# Patient Record
Sex: Male | Born: 1935 | Race: White | Hispanic: No | Marital: Married | Smoking: Never smoker
Health system: Southern US, Community
[De-identification: ages and names within clinical notes are randomized; demographics above are authoritative.]

## PROBLEM LIST (undated history)

## (undated) DIAGNOSIS — Z96651 Presence of right artificial knee joint: Secondary | ICD-10-CM

## (undated) DIAGNOSIS — I10 Essential (primary) hypertension: Secondary | ICD-10-CM

## (undated) DIAGNOSIS — M1990 Unspecified osteoarthritis, unspecified site: Secondary | ICD-10-CM

## (undated) DIAGNOSIS — R011 Cardiac murmur, unspecified: Secondary | ICD-10-CM

## (undated) HISTORY — PX: JOINT REPLACEMENT: SHX530

## (undated) HISTORY — PX: HERNIA REPAIR: SHX51

## (undated) HISTORY — PX: OTHER SURGICAL HISTORY: SHX169

---

## 2006-11-25 ENCOUNTER — Inpatient Hospital Stay (HOSPITAL_COMMUNITY): Admission: RE | Admit: 2006-11-25 | Discharge: 2006-11-26 | Payer: Self-pay | Admitting: Orthopedic Surgery

## 2009-12-15 ENCOUNTER — Ambulatory Visit (HOSPITAL_COMMUNITY): Admission: RE | Admit: 2009-12-15 | Discharge: 2009-12-15 | Payer: Self-pay | Admitting: Urology

## 2010-06-15 ENCOUNTER — Ambulatory Visit (HOSPITAL_COMMUNITY): Admission: RE | Admit: 2010-06-15 | Discharge: 2010-06-15 | Payer: Self-pay | Admitting: Urology

## 2010-10-25 ENCOUNTER — Other Ambulatory Visit (HOSPITAL_COMMUNITY): Payer: Self-pay | Admitting: Urology

## 2010-10-25 ENCOUNTER — Ambulatory Visit (HOSPITAL_COMMUNITY)
Admission: RE | Admit: 2010-10-25 | Discharge: 2010-10-25 | Disposition: A | Discharge status: Home / Self Care | Payer: Medicare Other | Source: Ambulatory Visit | Attending: Urology | Admitting: Urology

## 2010-10-25 ENCOUNTER — Other Ambulatory Visit: Payer: Self-pay | Admitting: Urology

## 2010-10-25 ENCOUNTER — Encounter (HOSPITAL_COMMUNITY): Payer: Medicare Other

## 2010-10-25 DIAGNOSIS — Z01812 Encounter for preprocedural laboratory examination: Secondary | ICD-10-CM | POA: Insufficient documentation

## 2010-10-25 DIAGNOSIS — N200 Calculus of kidney: Secondary | ICD-10-CM | POA: Insufficient documentation

## 2010-10-25 DIAGNOSIS — Z01818 Encounter for other preprocedural examination: Secondary | ICD-10-CM | POA: Insufficient documentation

## 2010-10-25 DIAGNOSIS — I10 Essential (primary) hypertension: Secondary | ICD-10-CM | POA: Insufficient documentation

## 2010-10-25 DIAGNOSIS — Z01810 Encounter for preprocedural cardiovascular examination: Secondary | ICD-10-CM | POA: Insufficient documentation

## 2010-10-25 LAB — BASIC METABOLIC PANEL
CO2: 29 mEq/L (ref 19–32)
Calcium: 9.1 mg/dL (ref 8.4–10.5)
Creatinine, Ser: 0.97 mg/dL (ref 0.4–1.5)
GFR calc Af Amer: >60 mL/min (ref >60)
GFR calc non Af Amer: >60 mL/min (ref >60)
Glucose, Bld: 106 mg/dL — ABNORMAL HIGH (ref 70–99)
Sodium: 138 mEq/L (ref 135–145)

## 2010-10-25 LAB — CBC
MCH: 32.2 pg (ref 26.0–34.0)
MCHC: 33.6 g/dL (ref 30.0–36.0)
RDW: 12.7 % (ref 11.5–15.5)

## 2010-10-25 LAB — SURGICAL PCR SCREEN: MRSA, PCR: NEGATIVE

## 2010-10-27 ENCOUNTER — Ambulatory Visit (HOSPITAL_COMMUNITY)
Admission: RE | Admit: 2010-10-27 | Discharge: 2010-10-27 | Disposition: A | Discharge status: Home / Self Care | Payer: Medicare Other | Source: Ambulatory Visit | Attending: Urology | Admitting: Urology

## 2010-10-27 DIAGNOSIS — N201 Calculus of ureter: Secondary | ICD-10-CM | POA: Insufficient documentation

## 2010-10-29 NOTE — Op Note (Signed)
NAME:  Julian Davis, Julian Davis NO.:  [id number]  MEDICAL RECORD NO.:  [id number]           PATIENT TYPE:  O  LOCATION:  DAYL                         FACILITY:  [facility]  PHYSICIAN:  Heloise Purpura, MD      DATE OF BIRTH:  [birth year]  DATE OF PROCEDURE:  10/27/2010 DATE OF DISCHARGE:                              OPERATIVE REPORT   PREOPERATIVE DIAGNOSIS:  Left ureteral calculi.  POSTOPERATIVE DIAGNOSIS:  Left ureteral calculi.  PROCEDURES: 1. Cystoscopy. 2. Left retrograde pyelography with interpretation. 3. Left ureteroscopy with laser lithotripsy and stone removal. 4. Left ureteral stent placement (6 x 24).  SURGEON:  Heloise Purpura, MD  ANESTHESIA:  General.  ESTIMATED BLOOD LOSS:  Minimal.  INDICATION:  Julian Davis is an 77 year old gentleman with a history of urolithiasis.  He has undergone lithotripsy for proximal left ureteral calculi in the past and although he did pass some fragments, he does have retained left ureteral fragments on followup imaging studies despite being asymptomatic.  I therefore recommended that he proceed with more definitive ureteroscopic therapy considering his inability to pass the stones after shock wave lithotripsy.  The potential risks, complications, and alternative treatment options have been discussed in detail and informed consent was obtained.  DESCRIPTION OF PROCEDURE:  The patient was taken to the operating room and a general anesthetic was administered.  He was given preoperative antibiotics, placed in the dorsal lithotomy position, and prepped and draped in the usual sterile fashion.  Next, a preoperative time-out was performed.  Cystourethroscopy was performed which revealed no anterior urethral abnormalities.  The prostatic urethra was significant for a very large median lobe which was somewhat friable.  Inspection of the bladder revealed moderate trabeculation throughout the bladder without any bladder tumors, stones,  or other mucosal pathology.  The left ureteral orifice was then identified and intubated with a 6-French ureteral catheter.  Omnipaque contrast was injected which revealed a large filling defect in the vicinity of the distal ureter, which was consistent with a radio-opaque calculus seen on his scout images.  No other ureteral abnormalities were identified.  A 0.038 sensor guidewire was then advanced passed the stone and up into the left renal pelvis without resistance.  A 6-French semi-rigid ureteroscope was then advanced into the distal left ureter and the safety wire, which had been placed, was noted to be submucosal.  Therefore, a new 0.038 sensor guidewire was placed within the true lumen of the ureter up into the renal pelvis again under fluoroscopic guidance but also under direct vision within the distal ureter.  The previous wire was removed.  This second wire was then left in place and the ureteroscope was removed and replaced next in the distal ureter.  A 365 micron holmium laser fiber was then used to fragment the stone on the setting of 0.6 joules and 6 Hz.  The stone was fragmented into small pieces which were then removed with a 0 tip nitinol basket.  Once all fragments were removed from the ureter confirmed under direct vision, the wire was back loaded on the cystoscope and a 6 x 26 double-J ureteral  stent was advanced over the wire using Seldinger technique.  It was positioned appropriately under fluoroscopic and cystoscopic guidance and the wire was removed with a good curl noted in the renal pelvis as well as in the bladder.  The patient's bladder was emptied and the procedure ended.  He tolerated the procedure well and was able to be awakened and transferred to recovery unit in satisfactory condition.     Heloise Purpura, MD     LB/MEDQ  D:  10/27/2010  T:  10/27/2010  Job:  [id number]  Electronically Signed by Heloise Purpura MD on 10/29/2010 04:44:26 PM

## 2010-12-29 NOTE — Op Note (Signed)
NAME:  Julian Davis, Julian Davis NO.:  [id number]   MEDICAL RECORD NO.:  [id number]          PATIENT TYPE:  INP   LOCATION:  0005                         FACILITY:  [facility]   PHYSICIAN:  Madlyn Frankel. Charlann Boxer, M.D.  DATE OF BIRTH:  1927-03-25   DATE OF PROCEDURE:  11/25/2006  DATE OF DISCHARGE:                               OPERATIVE REPORT   PREOPERATIVE DIAGNOSIS:  Right knee medial compartment osteoarthritis.   POSTOPERATIVE DIAGNOSIS:  Right knee medial compartment osteoarthritis.   PROCEDURE:  Right unicompartmental knee replacement using a Biomet  Oxford knee system, size medium femur, C tibia right medial, and a size  6 mm insert.   SURGEON:  Madlyn Frankel. Charlann Boxer, M.D.   ASSISTANT:  Yetta Glassman. Mann, PA-C   ANESTHESIA:  Dermal, spinal.   COMPLICATIONS:  None.   DRAINS:  None.   TOURNIQUET TIME:  75 minutes at 250 mmHg.   INDICATIONS FOR PROCEDURE:  Julian Davis is a very pleasant 77 year old  gentleman who presented to the office for evaluation of knee pain with  the questions concerning a unicompartmental knee replacement.  Radiographs revealed bone-on-bone articulation of the medial side of the  knee with a correctable deformity.  His pain was primarily medial and we  discussed the options available this point, discussed nonoperative  intervention including a heel wedge or use of brace versus operative  intervention of a unicompartmental versus total knee replacement.  Given  the fact that he has not responded well to conservative measures, he  wished to proceed with surgical intervention.  He wished to discuss  unicompartmental versus total knee replacement, so we had that  discussion today.  Risks and benefits of both including the potential  need for revision surgery with a partial knee replacement at 77 years of  age was all reviewed.  Consent was obtained for partial knee  replacement.   PROCEDURE IN DETAIL:  The patient was brought to operative theater.  Once adequate anesthesia and preoperative antibiotics, 1 g of Ancef,  were administered, the patient was positioned supine.  A bump was placed  underneath the right hip.  The Oxford leg holder was then placed into  and the hip was flexed and abducted.  Landmarks identified and  coordinates of the room for evaluation for sawing.   The right lower extremity was placed in a tourniquet, then prepped and  prepped and draped in sterile fashion.  A paramidline incision was made  for medial arthrotomy.  The patella was subluxated as much as needed but  not dislocated.   Following arthrotomy the patient was noted to significant arthritis in  the medial compartment of the knee with a large effusion.  Bone on bone  articulation noted.  The patellofemoral compartment had some osteophytes  and small area of some grade 3 chondromalacia but nothing nearly as bad  as the medial aspect of the knee.   I at this point took time to debride osteophytes off the medial aspect  of the knee, off the tibia and the femur as well as in the notch.  Some  debridement of  patellar osteophytes was carried out as well.   Following this, attention was directed to the tibia.  With the  extramedullary guide in place and retractors placed, I resected bone off  the medial proximal tibia.  The extramedullary rod was placed parallel  to the tibia and appeared to medial along the tibia spine all way down.  The reciprocating saw was used first, followed by oscillating.  A of  wafer bone indicated significant degenerative changes but then a wear  pattern consistent with that of a functional anterior cruciate ligament.  Once I made this cut, I determined the size C femur would fit best.  This included debrided osteophytes off the medial proximal tibia.  That  was all the decompression of the MCL that was carried out.  At this  point I sized it and felt that it was at least a 5 spacer and maybe a 6.  I went ahead and placed the  intramedullary rod into the femur.  We then  went ahead and centered the medial femoral cutting jig into the middle  portion of the femur.  It was oriented in all planes parallel to the  intramedullary rod.  I then made the posterior cut, which was a wafer  that was consistent with that of the same segments of the size medium  femur.   At this point i went ahead and put the 0 spigot in, milled the distal  femur and trialled first with a 5 then but felt the 6 was better.  I was  able to get a #1 spacer block in with the knee at 20 degrees of  extension with no retractors.  For this reason I used the #5 spigot.  The #5 spigot was placed and the distal femur milled.  Following  debridement of the osteophytes medial, lateral and around the tibia as  well as the use of an osteotome anteriorly to allow for excursion of the  polyethylene, a trial was carried out again.  The felt very stable with  a size 6 spacer in both extension and flexion with a couple millimeters  of lift-off of 90 and at 20 degrees of flexion.  Given all these  parameters, all components were removed.  I drilled the distal femur to  allow for cement integration.  I then injected 40 mL of 0.25% Marcaine  with epinephrine.  The final components were opened, all but the poly.  The components were cemented in position with two-cement technique,  first the tibia and then the femur.  Once I was happy that there was no  excessive cement and all cement had cured, the final #6 spacer was  chosen based on the trial.  It snapped into position with a good sound  and was stable as noted.  Given all this, the knee was reirrigated and  palpated to make sure there were no loose fragments of cartilage or  bone.   Following this the leg was brought out of the leg holder and the knee  brought out to full extension.  I irrigated the knee again to make sure  there was no debris.  Then with the knee at 90 degrees of flexion, I reapproximated  the extensor mechanism with #1 Vicryl in interrupted  fashion.  Vicryl 2-0 was used in the subcu layer, followed by a running  4-0 Monocryl.  The patient's knee was then cleaned, dried and dressed  sterilely with Steri-Strips, dressing sponges and a bulky wrap.  He was  brought to recovery stable.      Madlyn Frankel Charlann Boxer, M.D.  Electronically Signed     MDO/MEDQ  D:  11/25/2006  T:  11/25/2006  Job:  [id number]

## 2010-12-29 NOTE — H&P (Signed)
NAME:  Julian Davis, Julian Davis NO.:  [id number]   MEDICAL RECORD NO.:  [id number]        PATIENT TYPE:  LINP   LOCATION:                               FACILITY:  [facility]   PHYSICIAN:  Madlyn Frankel. Charlann Boxer, M.D.  DATE OF BIRTH:  [birth year]   DATE OF ADMISSION:  11/25/2006  DATE OF DISCHARGE:                              HISTORY & PHYSICAL   PROCEDURE:  Right unicondylar knee a   ATTENDING PHYSICIAN:  Dr. Durene Romans procedure will be a right  unicondylar knee arthroplasty.   CHIEF COMPLAINT:  Right knee pain.   HISTORY OF PRESENT ILLNESS:  This is a 77 year old male with a history  of persistent progressive knee pain.  It has been refractory to all  conservative treatments.  He has been cleared pre-surgically by Dr.  Roney Marion for a right partial knee replacement.   PAST MEDICAL HISTORY:  Significant for hypertension, recently diagnosed.   FAMILY HISTORY:  Cancer.   SOCIAL HISTORY:  He is married.  Wife will be the primary caregiver  after the surgery.   DRUG ALLERGIES:  SULFA CAUSES BREAKOUT.  APPARENTLY, HCTZ HAS CAUSED  SOME DIZZINESS AND MALAISE IN THE PAST.   MEDICATIONS:  1. Lisinopril 5 mg one p.o. daily.  2. Aspirin 81 mg one p.o. daily.  3. Multi-vitamin.   REVIEW OF SYSTEMS:  None other than HPI.   PHYSICAL EXAMINATION:  VITAL SIGNS:  Pulse 72, respirations 18, blood  pressure 126/90.  GENERAL:  He is awake, alert and oriented, well-developed, well-  nourished, no acute distress.  NECK:  Supple.  No carotid bruits.  CHEST/LUNGS:  Clear to auscultation bilaterally.  HEART:  Regular rate and rhythm.  S1, S2 distinct.  ABDOMEN:  Soft, nontender, nondistended.  Bowel sounds present in all  four quadrants.  GENITOURINARY:  Deferred.  EXTREMITIES:  He does have full extension and flexion to 120+ in his  right knee.  He does have some medial sided tenderness with some medial  joint line tenderness, as well.  SKIN:  Dorsalis pedis pulses positive.  NEUROLOGIC:  He has intact distal sensibilities.   LABS:  EKG, chest x-ray are all pending.   IMPRESSION:  Right medial compartment osteoarthritis.   PLAN OF ACTION:  Right partial or unicondylar knee arthroplasty on November 25, 2006, [facility], by surgeon Dr. Durene Romans.   Risks and complications were discussed.  Questions were encouraged,  answered and reviewed.     ______________________________  Yetta Glassman [address], [place]      Madlyn Frankel. Charlann Boxer, M.D.  Electronically Signed    BLM/MEDQ  D:  11/15/2006  T:  11/15/2006  Job:  [id number]   cc:   Roney [facility], M.D.

## 2011-01-03 ENCOUNTER — Other Ambulatory Visit (HOSPITAL_COMMUNITY): Payer: Self-pay | Admitting: Orthopedic Surgery

## 2011-01-03 DIAGNOSIS — T84038A Mechanical loosening of other internal prosthetic joint, initial encounter: Secondary | ICD-10-CM

## 2011-01-03 DIAGNOSIS — M25561 Pain in right knee: Secondary | ICD-10-CM

## 2011-01-10 ENCOUNTER — Encounter (HOSPITAL_COMMUNITY)
Admission: RE | Admit: 2011-01-10 | Discharge: 2011-01-10 | Disposition: A | Discharge status: Home / Self Care | Payer: Medicare Other | Source: Ambulatory Visit | Attending: Orthopedic Surgery | Admitting: Orthopedic Surgery

## 2011-01-10 ENCOUNTER — Ambulatory Visit (HOSPITAL_COMMUNITY): Payer: Medicare Other

## 2011-01-10 ENCOUNTER — Encounter (HOSPITAL_COMMUNITY): Payer: Self-pay

## 2011-01-10 DIAGNOSIS — M25561 Pain in right knee: Secondary | ICD-10-CM

## 2011-01-10 DIAGNOSIS — M25569 Pain in unspecified knee: Secondary | ICD-10-CM | POA: Insufficient documentation

## 2011-01-10 DIAGNOSIS — T84038A Mechanical loosening of other internal prosthetic joint, initial encounter: Secondary | ICD-10-CM

## 2011-01-10 DIAGNOSIS — Z96659 Presence of unspecified artificial knee joint: Secondary | ICD-10-CM | POA: Insufficient documentation

## 2011-01-10 DIAGNOSIS — T84039A Mechanical loosening of unspecified internal prosthetic joint, initial encounter: Secondary | ICD-10-CM | POA: Insufficient documentation

## 2011-01-10 HISTORY — DX: Presence of right artificial knee joint: Z96.651

## 2011-01-10 MED ORDER — TECHNETIUM TC 99M MEDRONATE IV KIT
23.9000 | PACK | Freq: Once | INTRAVENOUS | Status: AC | PRN
  Administered 2011-01-10: 23.9 via INTRAVENOUS

## 2011-08-21 DIAGNOSIS — L570 Actinic keratosis: Secondary | ICD-10-CM | POA: Diagnosis not present

## 2011-10-11 IMAGING — CR DG ABDOMEN 1V
1 series · 1 of 1 positions shown · non-contrast
Comparison: 12/15/2009

CLINICAL DATA: Preop distal left ureteral stone.

ABDOMEN - 1 VIEW

[view not recorded]
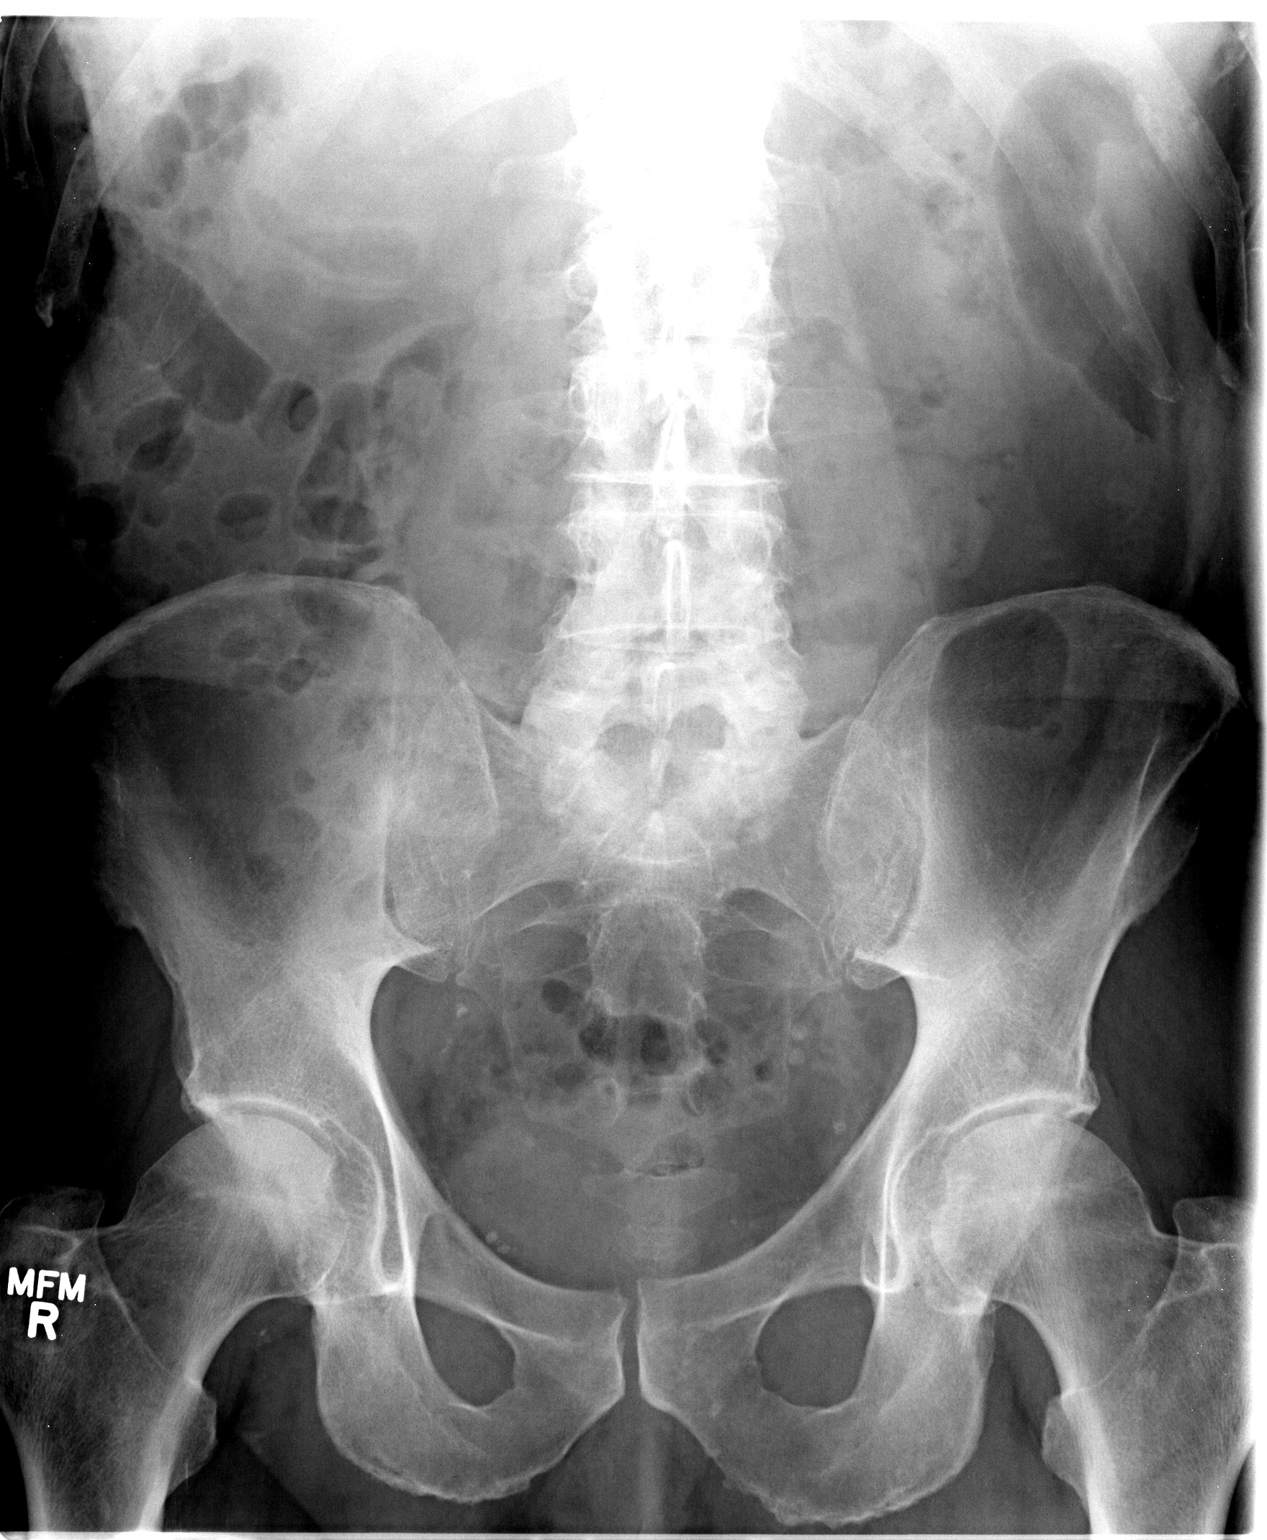

[1 of 1 positions shown; findings below may reference images not displayed]

FINDINGS: There are two 5 mm calcifications to the left of the
lower sacrum, which appear new from 12/15/2009.  Phleboliths are
seen in the anatomic pelvis.  Bowel gas pattern is unremarkable.
IMPRESSION: Two 5 mm calcifications located to the left of the lower sacrum
appear new from 12/15/2009, and may be within the distal left
ureter.

## 2011-12-31 DIAGNOSIS — J209 Acute bronchitis, unspecified: Secondary | ICD-10-CM | POA: Diagnosis not present

## 2011-12-31 DIAGNOSIS — J069 Acute upper respiratory infection, unspecified: Secondary | ICD-10-CM | POA: Diagnosis not present

## 2012-01-31 DIAGNOSIS — H2510 Age-related nuclear cataract, unspecified eye: Secondary | ICD-10-CM | POA: Diagnosis not present

## 2012-02-13 DIAGNOSIS — H2510 Age-related nuclear cataract, unspecified eye: Secondary | ICD-10-CM | POA: Diagnosis not present

## 2012-02-25 DIAGNOSIS — IMO0002 Reserved for concepts with insufficient information to code with codable children: Secondary | ICD-10-CM | POA: Diagnosis not present

## 2012-02-25 DIAGNOSIS — H2510 Age-related nuclear cataract, unspecified eye: Secondary | ICD-10-CM | POA: Diagnosis not present

## 2012-02-25 DIAGNOSIS — H269 Unspecified cataract: Secondary | ICD-10-CM | POA: Diagnosis not present

## 2012-03-06 DIAGNOSIS — I10 Essential (primary) hypertension: Secondary | ICD-10-CM | POA: Diagnosis not present

## 2012-03-06 DIAGNOSIS — N400 Enlarged prostate without lower urinary tract symptoms: Secondary | ICD-10-CM | POA: Diagnosis not present

## 2012-03-14 DIAGNOSIS — H2510 Age-related nuclear cataract, unspecified eye: Secondary | ICD-10-CM | POA: Diagnosis not present

## 2012-03-15 DIAGNOSIS — K2900 Acute gastritis without bleeding: Secondary | ICD-10-CM | POA: Diagnosis not present

## 2012-03-31 DIAGNOSIS — H269 Unspecified cataract: Secondary | ICD-10-CM | POA: Diagnosis not present

## 2012-03-31 DIAGNOSIS — IMO0002 Reserved for concepts with insufficient information to code with codable children: Secondary | ICD-10-CM | POA: Diagnosis not present

## 2012-03-31 DIAGNOSIS — H2510 Age-related nuclear cataract, unspecified eye: Secondary | ICD-10-CM | POA: Diagnosis not present

## 2012-05-07 IMAGING — NM NM BONE 3 PHASE
1 series · 6 of 6 positions shown · non-contrast
Comparison: Prior radiographs performed at [REDACTED].

CLINICAL DATA: Right knee pain. 719.46.  Previous right partial
knee replacement in 3557.

NUCLEAR MEDICINE THREE PHASE BONE SCAN
TECHNIQUE: Radionuclide angiographic images, immediate static
blood pool images, and 3-hour delayed static images were obtained
after intravenous injection of radiopharmaceutical.
Radiopharmaceutical: 23.9 mCi technetium 99m MDP IV

[Series 1: fl flow and static · 4.75mm/px · 6 of 40 frames shown]
[frame 4/40]
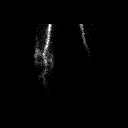
[frame 10/40  full-range]
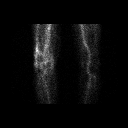
[frame 17/40  full-range]
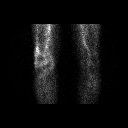
[frame 24/40  full-range]
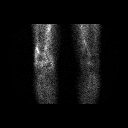
[frame 30/40  full-range]
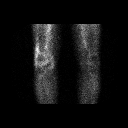
[frame 37/40  full-range]
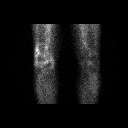

[6 of 6 positions shown; findings below may reference images not displayed]

FINDINGS: Perfusion and blood pool images demonstrateprominent
increased perfusion to the right knee with marked increased
activity in the synovium of the right knee joint as compared to the
left knee.

Delayed bone images do demonstrate increased activity under the
tibial component of the right partial knee prosthesis. There is
slight increased activity around the femoral component of the
prosthesis is well but I feel most of this activity is synovial in
origin.

There is increased activity in the lateral compartment which is
felt to be secondary to osteoarthritis.
IMPRESSION: 1.  Evidence suggestive of loosening of the tibial component of the
prosthesis.
2.  Intense increased perfusion and blood pool activity suggests
synovitis of the right knee.
3.  Osteoarthritis of the lateral compartment of the right knee.

## 2012-08-20 DIAGNOSIS — L570 Actinic keratosis: Secondary | ICD-10-CM | POA: Diagnosis not present

## 2012-08-20 DIAGNOSIS — C44319 Basal cell carcinoma of skin of other parts of face: Secondary | ICD-10-CM | POA: Diagnosis not present

## 2012-10-15 DIAGNOSIS — J0100 Acute maxillary sinusitis, unspecified: Secondary | ICD-10-CM | POA: Diagnosis not present

## 2012-10-15 DIAGNOSIS — R109 Unspecified abdominal pain: Secondary | ICD-10-CM | POA: Diagnosis not present

## 2012-10-15 DIAGNOSIS — J209 Acute bronchitis, unspecified: Secondary | ICD-10-CM | POA: Diagnosis not present

## 2012-10-31 DIAGNOSIS — J0100 Acute maxillary sinusitis, unspecified: Secondary | ICD-10-CM | POA: Diagnosis not present

## 2012-11-05 DIAGNOSIS — H353190 Nonexudative age-related macular degeneration, unspecified eye, stage unspecified: Secondary | ICD-10-CM | POA: Diagnosis not present

## 2012-12-24 DIAGNOSIS — R1084 Generalized abdominal pain: Secondary | ICD-10-CM | POA: Diagnosis not present

## 2013-01-30 DIAGNOSIS — R609 Edema, unspecified: Secondary | ICD-10-CM | POA: Diagnosis not present

## 2013-02-08 DIAGNOSIS — J019 Acute sinusitis, unspecified: Secondary | ICD-10-CM | POA: Diagnosis not present

## 2013-02-08 DIAGNOSIS — J209 Acute bronchitis, unspecified: Secondary | ICD-10-CM | POA: Diagnosis not present

## 2013-03-02 DIAGNOSIS — M25569 Pain in unspecified knee: Secondary | ICD-10-CM | POA: Diagnosis not present

## 2013-05-08 DIAGNOSIS — J0100 Acute maxillary sinusitis, unspecified: Secondary | ICD-10-CM | POA: Diagnosis not present

## 2013-05-12 DIAGNOSIS — H26499 Other secondary cataract, unspecified eye: Secondary | ICD-10-CM | POA: Diagnosis not present

## 2013-05-12 DIAGNOSIS — H353190 Nonexudative age-related macular degeneration, unspecified eye, stage unspecified: Secondary | ICD-10-CM | POA: Diagnosis not present

## 2013-05-12 DIAGNOSIS — H04129 Dry eye syndrome of unspecified lacrimal gland: Secondary | ICD-10-CM | POA: Diagnosis not present

## 2013-05-12 DIAGNOSIS — H02059 Trichiasis without entropian unspecified eye, unspecified eyelid: Secondary | ICD-10-CM | POA: Diagnosis not present

## 2013-08-25 DIAGNOSIS — J0100 Acute maxillary sinusitis, unspecified: Secondary | ICD-10-CM | POA: Diagnosis not present

## 2013-08-27 DIAGNOSIS — C44319 Basal cell carcinoma of skin of other parts of face: Secondary | ICD-10-CM | POA: Diagnosis not present

## 2013-08-27 DIAGNOSIS — L570 Actinic keratosis: Secondary | ICD-10-CM | POA: Diagnosis not present

## 2013-09-30 DIAGNOSIS — C4401 Basal cell carcinoma of skin of lip: Secondary | ICD-10-CM | POA: Diagnosis not present

## 2013-09-30 DIAGNOSIS — C44319 Basal cell carcinoma of skin of other parts of face: Secondary | ICD-10-CM | POA: Diagnosis not present

## 2013-10-12 DIAGNOSIS — L821 Other seborrheic keratosis: Secondary | ICD-10-CM | POA: Diagnosis not present

## 2013-10-12 DIAGNOSIS — C44319 Basal cell carcinoma of skin of other parts of face: Secondary | ICD-10-CM | POA: Diagnosis not present

## 2013-10-12 DIAGNOSIS — L570 Actinic keratosis: Secondary | ICD-10-CM | POA: Diagnosis not present

## 2013-10-12 DIAGNOSIS — C4401 Basal cell carcinoma of skin of lip: Secondary | ICD-10-CM | POA: Diagnosis not present

## 2013-10-29 DIAGNOSIS — E7800 Pure hypercholesterolemia, unspecified: Secondary | ICD-10-CM | POA: Diagnosis not present

## 2013-10-29 DIAGNOSIS — D400 Neoplasm of uncertain behavior of prostate: Secondary | ICD-10-CM | POA: Diagnosis not present

## 2013-10-29 DIAGNOSIS — E559 Vitamin D deficiency, unspecified: Secondary | ICD-10-CM | POA: Diagnosis not present

## 2013-10-29 DIAGNOSIS — I10 Essential (primary) hypertension: Secondary | ICD-10-CM | POA: Diagnosis not present

## 2013-10-29 DIAGNOSIS — Z125 Encounter for screening for malignant neoplasm of prostate: Secondary | ICD-10-CM | POA: Diagnosis not present

## 2013-10-29 DIAGNOSIS — Z79899 Other long term (current) drug therapy: Secondary | ICD-10-CM | POA: Diagnosis not present

## 2014-01-12 DIAGNOSIS — L570 Actinic keratosis: Secondary | ICD-10-CM | POA: Diagnosis not present

## 2014-01-12 DIAGNOSIS — C44319 Basal cell carcinoma of skin of other parts of face: Secondary | ICD-10-CM | POA: Diagnosis not present

## 2014-01-20 DIAGNOSIS — C44319 Basal cell carcinoma of skin of other parts of face: Secondary | ICD-10-CM | POA: Diagnosis not present

## 2014-04-07 DIAGNOSIS — R1013 Epigastric pain: Secondary | ICD-10-CM | POA: Diagnosis not present

## 2014-04-13 DIAGNOSIS — R52 Pain, unspecified: Secondary | ICD-10-CM | POA: Diagnosis not present

## 2014-04-13 DIAGNOSIS — R1013 Epigastric pain: Secondary | ICD-10-CM | POA: Diagnosis not present

## 2014-04-29 DIAGNOSIS — K838 Other specified diseases of biliary tract: Secondary | ICD-10-CM | POA: Diagnosis not present

## 2014-04-29 DIAGNOSIS — R141 Gas pain: Secondary | ICD-10-CM | POA: Diagnosis not present

## 2014-05-19 DIAGNOSIS — H3531 Nonexudative age-related macular degeneration: Secondary | ICD-10-CM | POA: Diagnosis not present

## 2014-05-19 DIAGNOSIS — H26493 Other secondary cataract, bilateral: Secondary | ICD-10-CM | POA: Diagnosis not present

## 2014-06-25 DIAGNOSIS — S8001XA Contusion of right knee, initial encounter: Secondary | ICD-10-CM | POA: Diagnosis not present

## 2014-06-30 DIAGNOSIS — S8001XD Contusion of right knee, subsequent encounter: Secondary | ICD-10-CM | POA: Diagnosis not present

## 2014-06-30 DIAGNOSIS — M25561 Pain in right knee: Secondary | ICD-10-CM | POA: Diagnosis not present

## 2014-06-30 DIAGNOSIS — R262 Difficulty in walking, not elsewhere classified: Secondary | ICD-10-CM | POA: Diagnosis not present

## 2014-07-05 DIAGNOSIS — S8001XD Contusion of right knee, subsequent encounter: Secondary | ICD-10-CM | POA: Diagnosis not present

## 2014-07-05 DIAGNOSIS — R262 Difficulty in walking, not elsewhere classified: Secondary | ICD-10-CM | POA: Diagnosis not present

## 2014-07-05 DIAGNOSIS — M25561 Pain in right knee: Secondary | ICD-10-CM | POA: Diagnosis not present

## 2014-07-13 DIAGNOSIS — M25561 Pain in right knee: Secondary | ICD-10-CM | POA: Diagnosis not present

## 2014-07-13 DIAGNOSIS — R262 Difficulty in walking, not elsewhere classified: Secondary | ICD-10-CM | POA: Diagnosis not present

## 2014-07-13 DIAGNOSIS — S8001XD Contusion of right knee, subsequent encounter: Secondary | ICD-10-CM | POA: Diagnosis not present

## 2014-07-14 DIAGNOSIS — D0439 Carcinoma in situ of skin of other parts of face: Secondary | ICD-10-CM | POA: Diagnosis not present

## 2014-07-19 DIAGNOSIS — S8001XD Contusion of right knee, subsequent encounter: Secondary | ICD-10-CM | POA: Diagnosis not present

## 2014-07-19 DIAGNOSIS — R262 Difficulty in walking, not elsewhere classified: Secondary | ICD-10-CM | POA: Diagnosis not present

## 2014-07-19 DIAGNOSIS — M25561 Pain in right knee: Secondary | ICD-10-CM | POA: Diagnosis not present

## 2014-07-22 DIAGNOSIS — S8001XD Contusion of right knee, subsequent encounter: Secondary | ICD-10-CM | POA: Diagnosis not present

## 2014-07-22 DIAGNOSIS — R262 Difficulty in walking, not elsewhere classified: Secondary | ICD-10-CM | POA: Diagnosis not present

## 2014-07-22 DIAGNOSIS — M25561 Pain in right knee: Secondary | ICD-10-CM | POA: Diagnosis not present

## 2014-08-02 DIAGNOSIS — M25561 Pain in right knee: Secondary | ICD-10-CM | POA: Diagnosis not present

## 2014-08-02 DIAGNOSIS — S8001XD Contusion of right knee, subsequent encounter: Secondary | ICD-10-CM | POA: Diagnosis not present

## 2014-08-02 DIAGNOSIS — R262 Difficulty in walking, not elsewhere classified: Secondary | ICD-10-CM | POA: Diagnosis not present

## 2014-08-10 DIAGNOSIS — S8001XD Contusion of right knee, subsequent encounter: Secondary | ICD-10-CM | POA: Diagnosis not present

## 2014-08-15 DIAGNOSIS — J0100 Acute maxillary sinusitis, unspecified: Secondary | ICD-10-CM | POA: Diagnosis not present

## 2014-10-21 DIAGNOSIS — M1712 Unilateral primary osteoarthritis, left knee: Secondary | ICD-10-CM | POA: Diagnosis not present

## 2014-10-21 DIAGNOSIS — S8001XD Contusion of right knee, subsequent encounter: Secondary | ICD-10-CM | POA: Diagnosis not present

## 2014-11-01 DIAGNOSIS — J209 Acute bronchitis, unspecified: Secondary | ICD-10-CM | POA: Diagnosis not present

## 2014-11-02 DIAGNOSIS — Z96651 Presence of right artificial knee joint: Secondary | ICD-10-CM | POA: Diagnosis not present

## 2014-11-02 DIAGNOSIS — Z471 Aftercare following joint replacement surgery: Secondary | ICD-10-CM | POA: Diagnosis not present

## 2014-11-02 DIAGNOSIS — M25561 Pain in right knee: Secondary | ICD-10-CM | POA: Diagnosis not present

## 2014-11-16 DIAGNOSIS — H26493 Other secondary cataract, bilateral: Secondary | ICD-10-CM | POA: Diagnosis not present

## 2014-11-16 DIAGNOSIS — H3532 Exudative age-related macular degeneration: Secondary | ICD-10-CM | POA: Diagnosis not present

## 2014-11-17 ENCOUNTER — Encounter (INDEPENDENT_AMBULATORY_CARE_PROVIDER_SITE_OTHER): Payer: Medicare Other | Admitting: Ophthalmology

## 2014-11-17 DIAGNOSIS — H3531 Nonexudative age-related macular degeneration: Secondary | ICD-10-CM | POA: Diagnosis not present

## 2014-11-17 DIAGNOSIS — H35033 Hypertensive retinopathy, bilateral: Secondary | ICD-10-CM | POA: Diagnosis not present

## 2014-11-17 DIAGNOSIS — H3532 Exudative age-related macular degeneration: Secondary | ICD-10-CM

## 2014-11-17 DIAGNOSIS — I10 Essential (primary) hypertension: Secondary | ICD-10-CM | POA: Diagnosis not present

## 2014-11-17 DIAGNOSIS — H43813 Vitreous degeneration, bilateral: Secondary | ICD-10-CM

## 2014-11-18 DIAGNOSIS — Z96651 Presence of right artificial knee joint: Secondary | ICD-10-CM | POA: Diagnosis not present

## 2014-11-18 DIAGNOSIS — Z471 Aftercare following joint replacement surgery: Secondary | ICD-10-CM | POA: Diagnosis not present

## 2014-11-18 DIAGNOSIS — M25561 Pain in right knee: Secondary | ICD-10-CM | POA: Diagnosis not present

## 2014-11-29 DIAGNOSIS — K5660 Unspecified intestinal obstruction: Secondary | ICD-10-CM | POA: Diagnosis not present

## 2014-11-29 DIAGNOSIS — K921 Melena: Secondary | ICD-10-CM | POA: Diagnosis not present

## 2014-12-02 DIAGNOSIS — J309 Allergic rhinitis, unspecified: Secondary | ICD-10-CM | POA: Diagnosis not present

## 2014-12-02 DIAGNOSIS — J0100 Acute maxillary sinusitis, unspecified: Secondary | ICD-10-CM | POA: Diagnosis not present

## 2014-12-13 ENCOUNTER — Encounter (INDEPENDENT_AMBULATORY_CARE_PROVIDER_SITE_OTHER): Payer: Medicare Other | Admitting: Ophthalmology

## 2014-12-13 DIAGNOSIS — I10 Essential (primary) hypertension: Secondary | ICD-10-CM

## 2014-12-13 DIAGNOSIS — H43813 Vitreous degeneration, bilateral: Secondary | ICD-10-CM

## 2014-12-13 DIAGNOSIS — H3531 Nonexudative age-related macular degeneration: Secondary | ICD-10-CM

## 2014-12-13 DIAGNOSIS — H3532 Exudative age-related macular degeneration: Secondary | ICD-10-CM | POA: Diagnosis not present

## 2014-12-13 DIAGNOSIS — H35033 Hypertensive retinopathy, bilateral: Secondary | ICD-10-CM

## 2014-12-15 DIAGNOSIS — K921 Melena: Secondary | ICD-10-CM | POA: Diagnosis not present

## 2014-12-15 DIAGNOSIS — K5730 Diverticulosis of large intestine without perforation or abscess without bleeding: Secondary | ICD-10-CM | POA: Diagnosis not present

## 2015-01-13 DIAGNOSIS — L821 Other seborrheic keratosis: Secondary | ICD-10-CM | POA: Diagnosis not present

## 2015-01-13 DIAGNOSIS — L570 Actinic keratosis: Secondary | ICD-10-CM | POA: Diagnosis not present

## 2015-01-17 ENCOUNTER — Encounter (INDEPENDENT_AMBULATORY_CARE_PROVIDER_SITE_OTHER): Payer: Medicare Other | Admitting: Ophthalmology

## 2015-01-17 DIAGNOSIS — H3531 Nonexudative age-related macular degeneration: Secondary | ICD-10-CM | POA: Diagnosis not present

## 2015-01-17 DIAGNOSIS — H43813 Vitreous degeneration, bilateral: Secondary | ICD-10-CM | POA: Diagnosis not present

## 2015-01-17 DIAGNOSIS — H35033 Hypertensive retinopathy, bilateral: Secondary | ICD-10-CM

## 2015-01-17 DIAGNOSIS — I10 Essential (primary) hypertension: Secondary | ICD-10-CM

## 2015-01-17 DIAGNOSIS — H3532 Exudative age-related macular degeneration: Secondary | ICD-10-CM

## 2015-01-26 DIAGNOSIS — H26492 Other secondary cataract, left eye: Secondary | ICD-10-CM | POA: Diagnosis not present

## 2015-02-03 DIAGNOSIS — H26491 Other secondary cataract, right eye: Secondary | ICD-10-CM | POA: Diagnosis not present

## 2015-02-15 DIAGNOSIS — R112 Nausea with vomiting, unspecified: Secondary | ICD-10-CM | POA: Diagnosis not present

## 2015-02-15 DIAGNOSIS — Z882 Allergy status to sulfonamides status: Secondary | ICD-10-CM | POA: Diagnosis not present

## 2015-02-15 DIAGNOSIS — R109 Unspecified abdominal pain: Secondary | ICD-10-CM | POA: Diagnosis not present

## 2015-02-15 DIAGNOSIS — K5730 Diverticulosis of large intestine without perforation or abscess without bleeding: Secondary | ICD-10-CM | POA: Diagnosis not present

## 2015-02-15 DIAGNOSIS — J984 Other disorders of lung: Secondary | ICD-10-CM | POA: Diagnosis not present

## 2015-02-15 DIAGNOSIS — Z79899 Other long term (current) drug therapy: Secondary | ICD-10-CM | POA: Diagnosis not present

## 2015-02-15 DIAGNOSIS — E860 Dehydration: Secondary | ICD-10-CM | POA: Diagnosis not present

## 2015-02-15 DIAGNOSIS — K6389 Other specified diseases of intestine: Secondary | ICD-10-CM | POA: Diagnosis not present

## 2015-02-15 DIAGNOSIS — N400 Enlarged prostate without lower urinary tract symptoms: Secondary | ICD-10-CM | POA: Diagnosis not present

## 2015-02-15 DIAGNOSIS — K5660 Unspecified intestinal obstruction: Secondary | ICD-10-CM | POA: Diagnosis not present

## 2015-02-15 DIAGNOSIS — I10 Essential (primary) hypertension: Secondary | ICD-10-CM | POA: Diagnosis not present

## 2015-02-15 DIAGNOSIS — K5669 Other intestinal obstruction: Secondary | ICD-10-CM | POA: Diagnosis not present

## 2015-02-21 ENCOUNTER — Encounter (INDEPENDENT_AMBULATORY_CARE_PROVIDER_SITE_OTHER): Payer: Medicare Other | Admitting: Ophthalmology

## 2015-02-24 DIAGNOSIS — N400 Enlarged prostate without lower urinary tract symptoms: Secondary | ICD-10-CM | POA: Diagnosis not present

## 2015-02-24 DIAGNOSIS — K565 Intestinal adhesions [bands] with obstruction (postprocedural) (postinfection): Secondary | ICD-10-CM | POA: Diagnosis not present

## 2015-02-24 DIAGNOSIS — K5660 Unspecified intestinal obstruction: Secondary | ICD-10-CM | POA: Diagnosis not present

## 2015-02-24 DIAGNOSIS — Z9049 Acquired absence of other specified parts of digestive tract: Secondary | ICD-10-CM | POA: Diagnosis present

## 2015-02-24 DIAGNOSIS — E860 Dehydration: Secondary | ICD-10-CM | POA: Diagnosis not present

## 2015-02-24 DIAGNOSIS — R112 Nausea with vomiting, unspecified: Secondary | ICD-10-CM | POA: Diagnosis not present

## 2015-02-24 DIAGNOSIS — K660 Peritoneal adhesions (postprocedural) (postinfection): Secondary | ICD-10-CM | POA: Diagnosis not present

## 2015-02-24 DIAGNOSIS — R109 Unspecified abdominal pain: Secondary | ICD-10-CM | POA: Diagnosis not present

## 2015-02-24 DIAGNOSIS — I10 Essential (primary) hypertension: Secondary | ICD-10-CM | POA: Diagnosis not present

## 2015-02-24 DIAGNOSIS — Z882 Allergy status to sulfonamides status: Secondary | ICD-10-CM | POA: Diagnosis not present

## 2015-02-24 DIAGNOSIS — K430 Incisional hernia with obstruction, without gangrene: Secondary | ICD-10-CM | POA: Diagnosis not present

## 2015-02-24 DIAGNOSIS — Z980 Intestinal bypass and anastomosis status: Secondary | ICD-10-CM | POA: Diagnosis not present

## 2015-02-24 DIAGNOSIS — K5730 Diverticulosis of large intestine without perforation or abscess without bleeding: Secondary | ICD-10-CM | POA: Diagnosis not present

## 2015-02-24 DIAGNOSIS — K5669 Other intestinal obstruction: Secondary | ICD-10-CM | POA: Diagnosis not present

## 2015-02-24 DIAGNOSIS — K562 Volvulus: Secondary | ICD-10-CM | POA: Diagnosis not present

## 2015-02-24 DIAGNOSIS — K598 Other specified functional intestinal disorders: Secondary | ICD-10-CM | POA: Diagnosis not present

## 2015-02-24 DIAGNOSIS — K432 Incisional hernia without obstruction or gangrene: Secondary | ICD-10-CM | POA: Diagnosis present

## 2015-02-24 DIAGNOSIS — K633 Ulcer of intestine: Secondary | ICD-10-CM | POA: Diagnosis not present

## 2015-02-28 ENCOUNTER — Encounter (INDEPENDENT_AMBULATORY_CARE_PROVIDER_SITE_OTHER): Payer: Medicare Other | Admitting: Ophthalmology

## 2015-03-07 ENCOUNTER — Encounter (INDEPENDENT_AMBULATORY_CARE_PROVIDER_SITE_OTHER): Payer: Medicare Other | Admitting: Ophthalmology

## 2015-03-22 ENCOUNTER — Encounter (INDEPENDENT_AMBULATORY_CARE_PROVIDER_SITE_OTHER): Payer: Medicare Other | Admitting: Ophthalmology

## 2015-03-22 DIAGNOSIS — H3532 Exudative age-related macular degeneration: Secondary | ICD-10-CM | POA: Diagnosis not present

## 2015-03-22 DIAGNOSIS — I10 Essential (primary) hypertension: Secondary | ICD-10-CM | POA: Diagnosis not present

## 2015-03-22 DIAGNOSIS — H35033 Hypertensive retinopathy, bilateral: Secondary | ICD-10-CM | POA: Diagnosis not present

## 2015-03-22 DIAGNOSIS — H43813 Vitreous degeneration, bilateral: Secondary | ICD-10-CM | POA: Diagnosis not present

## 2015-03-22 DIAGNOSIS — H3531 Nonexudative age-related macular degeneration: Secondary | ICD-10-CM

## 2015-03-31 DIAGNOSIS — R262 Difficulty in walking, not elsewhere classified: Secondary | ICD-10-CM | POA: Diagnosis not present

## 2015-03-31 DIAGNOSIS — M25562 Pain in left knee: Secondary | ICD-10-CM | POA: Diagnosis not present

## 2015-03-31 DIAGNOSIS — M25561 Pain in right knee: Secondary | ICD-10-CM | POA: Diagnosis not present

## 2015-03-31 DIAGNOSIS — M170 Bilateral primary osteoarthritis of knee: Secondary | ICD-10-CM | POA: Diagnosis not present

## 2015-04-05 DIAGNOSIS — M1711 Unilateral primary osteoarthritis, right knee: Secondary | ICD-10-CM | POA: Diagnosis not present

## 2015-04-05 DIAGNOSIS — M25561 Pain in right knee: Secondary | ICD-10-CM | POA: Diagnosis not present

## 2015-04-07 DIAGNOSIS — E780 Pure hypercholesterolemia: Secondary | ICD-10-CM | POA: Diagnosis not present

## 2015-04-07 DIAGNOSIS — I341 Nonrheumatic mitral (valve) prolapse: Secondary | ICD-10-CM | POA: Diagnosis not present

## 2015-04-07 DIAGNOSIS — I10 Essential (primary) hypertension: Secondary | ICD-10-CM | POA: Diagnosis not present

## 2015-04-07 DIAGNOSIS — N401 Enlarged prostate with lower urinary tract symptoms: Secondary | ICD-10-CM | POA: Diagnosis not present

## 2015-04-08 DIAGNOSIS — M25562 Pain in left knee: Secondary | ICD-10-CM | POA: Diagnosis not present

## 2015-04-08 DIAGNOSIS — M1712 Unilateral primary osteoarthritis, left knee: Secondary | ICD-10-CM | POA: Diagnosis not present

## 2015-04-08 DIAGNOSIS — M25561 Pain in right knee: Secondary | ICD-10-CM | POA: Diagnosis not present

## 2015-04-08 DIAGNOSIS — R2689 Other abnormalities of gait and mobility: Secondary | ICD-10-CM | POA: Diagnosis not present

## 2015-04-08 DIAGNOSIS — M170 Bilateral primary osteoarthritis of knee: Secondary | ICD-10-CM | POA: Diagnosis not present

## 2015-04-12 DIAGNOSIS — M25561 Pain in right knee: Secondary | ICD-10-CM | POA: Diagnosis not present

## 2015-04-12 DIAGNOSIS — M1711 Unilateral primary osteoarthritis, right knee: Secondary | ICD-10-CM | POA: Diagnosis not present

## 2015-04-15 DIAGNOSIS — M1712 Unilateral primary osteoarthritis, left knee: Secondary | ICD-10-CM | POA: Diagnosis not present

## 2015-04-15 DIAGNOSIS — R2689 Other abnormalities of gait and mobility: Secondary | ICD-10-CM | POA: Diagnosis not present

## 2015-04-15 DIAGNOSIS — M25561 Pain in right knee: Secondary | ICD-10-CM | POA: Diagnosis not present

## 2015-04-15 DIAGNOSIS — M25562 Pain in left knee: Secondary | ICD-10-CM | POA: Diagnosis not present

## 2015-04-15 DIAGNOSIS — M170 Bilateral primary osteoarthritis of knee: Secondary | ICD-10-CM | POA: Diagnosis not present

## 2015-04-19 DIAGNOSIS — M1711 Unilateral primary osteoarthritis, right knee: Secondary | ICD-10-CM | POA: Diagnosis not present

## 2015-04-19 DIAGNOSIS — M170 Bilateral primary osteoarthritis of knee: Secondary | ICD-10-CM | POA: Diagnosis not present

## 2015-04-19 DIAGNOSIS — M25562 Pain in left knee: Secondary | ICD-10-CM | POA: Diagnosis not present

## 2015-04-19 DIAGNOSIS — M25561 Pain in right knee: Secondary | ICD-10-CM | POA: Diagnosis not present

## 2015-04-19 DIAGNOSIS — R2689 Other abnormalities of gait and mobility: Secondary | ICD-10-CM | POA: Diagnosis not present

## 2015-04-22 DIAGNOSIS — M1712 Unilateral primary osteoarthritis, left knee: Secondary | ICD-10-CM | POA: Diagnosis not present

## 2015-04-22 DIAGNOSIS — R2689 Other abnormalities of gait and mobility: Secondary | ICD-10-CM | POA: Diagnosis not present

## 2015-04-22 DIAGNOSIS — M25562 Pain in left knee: Secondary | ICD-10-CM | POA: Diagnosis not present

## 2015-04-22 DIAGNOSIS — M25561 Pain in right knee: Secondary | ICD-10-CM | POA: Diagnosis not present

## 2015-04-22 DIAGNOSIS — M170 Bilateral primary osteoarthritis of knee: Secondary | ICD-10-CM | POA: Diagnosis not present

## 2015-04-26 ENCOUNTER — Encounter (INDEPENDENT_AMBULATORY_CARE_PROVIDER_SITE_OTHER): Payer: Medicare Other | Admitting: Ophthalmology

## 2015-04-26 DIAGNOSIS — H3531 Nonexudative age-related macular degeneration: Secondary | ICD-10-CM | POA: Diagnosis not present

## 2015-04-26 DIAGNOSIS — H43813 Vitreous degeneration, bilateral: Secondary | ICD-10-CM

## 2015-04-26 DIAGNOSIS — I10 Essential (primary) hypertension: Secondary | ICD-10-CM | POA: Diagnosis not present

## 2015-04-26 DIAGNOSIS — H35033 Hypertensive retinopathy, bilateral: Secondary | ICD-10-CM | POA: Diagnosis not present

## 2015-04-26 DIAGNOSIS — H3532 Exudative age-related macular degeneration: Secondary | ICD-10-CM

## 2015-04-27 DIAGNOSIS — M25561 Pain in right knee: Secondary | ICD-10-CM | POA: Diagnosis not present

## 2015-04-27 DIAGNOSIS — M1711 Unilateral primary osteoarthritis, right knee: Secondary | ICD-10-CM | POA: Diagnosis not present

## 2015-04-27 DIAGNOSIS — M170 Bilateral primary osteoarthritis of knee: Secondary | ICD-10-CM | POA: Diagnosis not present

## 2015-04-27 DIAGNOSIS — M25562 Pain in left knee: Secondary | ICD-10-CM | POA: Diagnosis not present

## 2015-04-27 DIAGNOSIS — R2689 Other abnormalities of gait and mobility: Secondary | ICD-10-CM | POA: Diagnosis not present

## 2015-04-29 DIAGNOSIS — M25562 Pain in left knee: Secondary | ICD-10-CM | POA: Diagnosis not present

## 2015-04-29 DIAGNOSIS — M1712 Unilateral primary osteoarthritis, left knee: Secondary | ICD-10-CM | POA: Diagnosis not present

## 2015-04-29 DIAGNOSIS — R2689 Other abnormalities of gait and mobility: Secondary | ICD-10-CM | POA: Diagnosis not present

## 2015-04-29 DIAGNOSIS — M1710 Unilateral primary osteoarthritis, unspecified knee: Secondary | ICD-10-CM | POA: Diagnosis not present

## 2015-04-29 DIAGNOSIS — M1711 Unilateral primary osteoarthritis, right knee: Secondary | ICD-10-CM | POA: Diagnosis not present

## 2015-04-29 DIAGNOSIS — M170 Bilateral primary osteoarthritis of knee: Secondary | ICD-10-CM | POA: Diagnosis not present

## 2015-05-17 DIAGNOSIS — M25561 Pain in right knee: Secondary | ICD-10-CM | POA: Diagnosis not present

## 2015-05-17 DIAGNOSIS — M1711 Unilateral primary osteoarthritis, right knee: Secondary | ICD-10-CM | POA: Diagnosis not present

## 2015-05-17 DIAGNOSIS — R262 Difficulty in walking, not elsewhere classified: Secondary | ICD-10-CM | POA: Diagnosis not present

## 2015-05-31 ENCOUNTER — Encounter (INDEPENDENT_AMBULATORY_CARE_PROVIDER_SITE_OTHER): Payer: Medicare Other | Admitting: Ophthalmology

## 2015-05-31 DIAGNOSIS — H353124 Nonexudative age-related macular degeneration, left eye, advanced atrophic with subfoveal involvement: Secondary | ICD-10-CM

## 2015-05-31 DIAGNOSIS — I10 Essential (primary) hypertension: Secondary | ICD-10-CM

## 2015-05-31 DIAGNOSIS — H353211 Exudative age-related macular degeneration, right eye, with active choroidal neovascularization: Secondary | ICD-10-CM

## 2015-05-31 DIAGNOSIS — H43813 Vitreous degeneration, bilateral: Secondary | ICD-10-CM

## 2015-05-31 DIAGNOSIS — H35033 Hypertensive retinopathy, bilateral: Secondary | ICD-10-CM

## 2015-06-28 ENCOUNTER — Encounter (INDEPENDENT_AMBULATORY_CARE_PROVIDER_SITE_OTHER): Payer: Medicare Other | Admitting: Ophthalmology

## 2015-06-28 DIAGNOSIS — H353124 Nonexudative age-related macular degeneration, left eye, advanced atrophic with subfoveal involvement: Secondary | ICD-10-CM

## 2015-06-28 DIAGNOSIS — H43813 Vitreous degeneration, bilateral: Secondary | ICD-10-CM | POA: Diagnosis not present

## 2015-06-28 DIAGNOSIS — H35033 Hypertensive retinopathy, bilateral: Secondary | ICD-10-CM | POA: Diagnosis not present

## 2015-06-28 DIAGNOSIS — I10 Essential (primary) hypertension: Secondary | ICD-10-CM

## 2015-06-28 DIAGNOSIS — H353211 Exudative age-related macular degeneration, right eye, with active choroidal neovascularization: Secondary | ICD-10-CM

## 2015-07-14 DIAGNOSIS — R233 Spontaneous ecchymoses: Secondary | ICD-10-CM | POA: Diagnosis not present

## 2015-07-14 DIAGNOSIS — L570 Actinic keratosis: Secondary | ICD-10-CM | POA: Diagnosis not present

## 2015-07-14 DIAGNOSIS — L304 Erythema intertrigo: Secondary | ICD-10-CM | POA: Diagnosis not present

## 2015-07-26 ENCOUNTER — Encounter (INDEPENDENT_AMBULATORY_CARE_PROVIDER_SITE_OTHER): Payer: Medicare Other | Admitting: Ophthalmology

## 2015-07-29 DIAGNOSIS — J0100 Acute maxillary sinusitis, unspecified: Secondary | ICD-10-CM | POA: Diagnosis not present

## 2015-08-02 ENCOUNTER — Encounter (INDEPENDENT_AMBULATORY_CARE_PROVIDER_SITE_OTHER): Payer: Medicare Other | Admitting: Ophthalmology

## 2015-08-02 DIAGNOSIS — I10 Essential (primary) hypertension: Secondary | ICD-10-CM

## 2015-08-02 DIAGNOSIS — H353211 Exudative age-related macular degeneration, right eye, with active choroidal neovascularization: Secondary | ICD-10-CM | POA: Diagnosis not present

## 2015-08-02 DIAGNOSIS — H43813 Vitreous degeneration, bilateral: Secondary | ICD-10-CM

## 2015-08-02 DIAGNOSIS — H353124 Nonexudative age-related macular degeneration, left eye, advanced atrophic with subfoveal involvement: Secondary | ICD-10-CM

## 2015-08-02 DIAGNOSIS — H35033 Hypertensive retinopathy, bilateral: Secondary | ICD-10-CM | POA: Diagnosis not present

## 2015-08-30 ENCOUNTER — Encounter (INDEPENDENT_AMBULATORY_CARE_PROVIDER_SITE_OTHER): Payer: Medicare Other | Admitting: Ophthalmology

## 2015-08-30 DIAGNOSIS — H43813 Vitreous degeneration, bilateral: Secondary | ICD-10-CM | POA: Diagnosis not present

## 2015-08-30 DIAGNOSIS — I10 Essential (primary) hypertension: Secondary | ICD-10-CM

## 2015-08-30 DIAGNOSIS — H35033 Hypertensive retinopathy, bilateral: Secondary | ICD-10-CM

## 2015-08-30 DIAGNOSIS — H353211 Exudative age-related macular degeneration, right eye, with active choroidal neovascularization: Secondary | ICD-10-CM | POA: Diagnosis not present

## 2015-08-30 DIAGNOSIS — H353124 Nonexudative age-related macular degeneration, left eye, advanced atrophic with subfoveal involvement: Secondary | ICD-10-CM | POA: Diagnosis not present

## 2015-09-01 DIAGNOSIS — M1711 Unilateral primary osteoarthritis, right knee: Secondary | ICD-10-CM | POA: Diagnosis not present

## 2015-09-01 DIAGNOSIS — M25561 Pain in right knee: Secondary | ICD-10-CM | POA: Diagnosis not present

## 2015-09-12 DIAGNOSIS — M1711 Unilateral primary osteoarthritis, right knee: Secondary | ICD-10-CM | POA: Diagnosis not present

## 2015-09-13 DIAGNOSIS — Z1389 Encounter for screening for other disorder: Secondary | ICD-10-CM | POA: Diagnosis not present

## 2015-09-13 DIAGNOSIS — E785 Hyperlipidemia, unspecified: Secondary | ICD-10-CM | POA: Diagnosis not present

## 2015-09-13 DIAGNOSIS — E559 Vitamin D deficiency, unspecified: Secondary | ICD-10-CM | POA: Diagnosis not present

## 2015-09-13 DIAGNOSIS — I10 Essential (primary) hypertension: Secondary | ICD-10-CM | POA: Diagnosis not present

## 2015-09-13 DIAGNOSIS — R7309 Other abnormal glucose: Secondary | ICD-10-CM | POA: Diagnosis not present

## 2015-09-13 DIAGNOSIS — Z23 Encounter for immunization: Secondary | ICD-10-CM | POA: Diagnosis not present

## 2015-09-13 DIAGNOSIS — Z125 Encounter for screening for malignant neoplasm of prostate: Secondary | ICD-10-CM | POA: Diagnosis not present

## 2015-09-13 DIAGNOSIS — Z79899 Other long term (current) drug therapy: Secondary | ICD-10-CM | POA: Diagnosis not present

## 2015-09-25 NOTE — H&P (Signed)
TOTAL KNEE REVISION ADMISSION H&P  Patient is being admitted for conversion of right UKR to TKA.  Subjective:  Chief Complaint:    Failed right UKR secondary to progressive OA / pain  HPI: Julian Davis, 82 y.o. male, has a history of pain and functional disability in the right knee(s) due to arthritis and patient has failed non-surgical conservative treatments for greater than 12 weeks to include NSAID's and/or analgesics and activity modification. The indications for the revision of the total knee arthroplasty are progressive OA in the PF and lateral compartments. Onset of symptoms was gradual starting  years ago with gradually worsening course since that time.  Prior procedures on the right knee(s) include unicompartmental arthroplasty.  Patient currently rates pain in the right knee(s) at 8 out of 10 with activity. There is night pain, worsening of pain with activity and weight bearing, pain that interferes with activities of daily living, pain with passive range of motion, crepitus and joint swelling.  Patient has evidence of progressive OA in the PF and lateral compartments by imaging studies. This condition presents safety issues increasing the risk of falls.  There is no current active infection.  Risks, benefits and expectations were discussed with the patient.  Risks including but not limited to the risk of anesthesia, blood clots, nerve damage, blood vessel damage, failure of the prosthesis, infection and up to and including death.  Patient understand the risks, benefits and expectations and wishes to proceed with surgery.   PCP: Myrlene Broker, MD  D/C Plans:      Home with HHPT  Post-op Meds:       No Rx given   Tranexamic Acid:      To be given - IV   Decadron:      Is to be given  FYI:     Xarelto (not able to tolerate ASA)  Norco    Past Medical History  Diagnosis Date  . Status post right partial knee replacement   . Hypertension   . Heart murmur     hx of  .  Arthritis     Past Surgical History  Procedure Laterality Date  . Hernia repair      x 2  . Intestinal blockage surgeries x 2  2016, ?  . 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 Joint replacement      partial right knee replacement     No prescriptions prior to admission   Allergies  Allergen Reactions  . Sulfa Antibiotics Hives    Social History  Substance Use Topics  . Smoking status: Never Smoker   . Smokeless tobacco: Never Used  . Alcohol Use: No       Review of Systems  Constitutional: Negative.   HENT: Negative.   Eyes: Negative.   Respiratory: Positive for shortness of breath (on exertion).   Cardiovascular: Negative.   Gastrointestinal: Negative.   Genitourinary: Negative.   Musculoskeletal: Positive for joint pain.  Skin: Negative.   Neurological: Negative.   Endo/Heme/Allergies: Negative.   Psychiatric/Behavioral: Negative.      Objective:  Physical Exam  Constitutional: He is oriented to person, place, and time. He appears well-developed.  HENT:  Head: Normocephalic.  Eyes: Pupils are equal, round, and reactive to light.  Neck: Neck supple. No JVD present. No tracheal deviation present. No thyromegaly present.  Cardiovascular: Normal rate, regular rhythm and intact distal pulses.   Respiratory: Effort normal and breath sounds  normal. No stridor. No respiratory distress. He has no wheezes.  GI: Soft. There is no tenderness. There is no guarding.  Musculoskeletal:       Right knee: He exhibits decreased range of motion, swelling, laceration (healed previous incision) and bony tenderness. He exhibits no ecchymosis, no deformity, no erythema and normal alignment. Tenderness found.  Lymphadenopathy:    He has no cervical adenopathy.  Neurological: He is alert and oriented to person, place, and time.  Skin: Skin is warm and dry.  Psychiatric: He has a normal mood and affect.     Imaging Review Plain  radiographs demonstrate severe degenerative joint disease of the right knee(s). The overall alignment is neutral.  The bone quality appears to be good for age and reported activity level.  Assessment/Plan:  End stage arthritis, right knee(s) with failed previous arthroplasty.   The patient history, physical examination, clinical judgment of the provider and imaging studies are consistent with end stage degenerative joint disease of the right knee(s), previous total knee arthroplasty. Revision total knee arthroplasty is deemed medically necessary. The treatment options including medical management, injection therapy, arthroscopy and revision arthroplasty were discussed at length. The risks and benefits of revision total knee arthroplasty were presented and reviewed. The risks due to aseptic loosening, infection, stiffness, patella tracking problems, thromboembolic complications and other imponderables were discussed. The patient acknowledged the explanation, agreed to proceed with the plan and consent was signed. Patient is being admitted for inpatient treatment for surgery, pain control, PT, OT, prophylactic antibiotics, VTE prophylaxis, progressive ambulation and ADL's and discharge planning.The patient is planning to be discharged home with home health services.     West Pugh Tangy Drozdowski   PA-C  10/03/2015, 12:07 PM

## 2015-09-27 ENCOUNTER — Encounter (INDEPENDENT_AMBULATORY_CARE_PROVIDER_SITE_OTHER): Payer: Medicare Other | Admitting: Ophthalmology

## 2015-09-27 DIAGNOSIS — H35033 Hypertensive retinopathy, bilateral: Secondary | ICD-10-CM | POA: Diagnosis not present

## 2015-09-27 DIAGNOSIS — H353124 Nonexudative age-related macular degeneration, left eye, advanced atrophic with subfoveal involvement: Secondary | ICD-10-CM

## 2015-09-27 DIAGNOSIS — H353211 Exudative age-related macular degeneration, right eye, with active choroidal neovascularization: Secondary | ICD-10-CM | POA: Diagnosis not present

## 2015-09-27 DIAGNOSIS — I10 Essential (primary) hypertension: Secondary | ICD-10-CM

## 2015-09-27 DIAGNOSIS — H43813 Vitreous degeneration, bilateral: Secondary | ICD-10-CM

## 2015-10-03 ENCOUNTER — Encounter (HOSPITAL_COMMUNITY)
Admission: RE | Admit: 2015-10-03 | Discharge: 2015-10-03 | Disposition: A | Discharge status: Home / Self Care | Payer: Medicare Other | Source: Ambulatory Visit | Attending: Orthopedic Surgery | Admitting: Orthopedic Surgery

## 2015-10-03 ENCOUNTER — Encounter (HOSPITAL_COMMUNITY): Payer: Self-pay

## 2015-10-03 DIAGNOSIS — M1711 Unilateral primary osteoarthritis, right knee: Secondary | ICD-10-CM | POA: Diagnosis not present

## 2015-10-03 DIAGNOSIS — Z01818 Encounter for other preprocedural examination: Secondary | ICD-10-CM | POA: Diagnosis not present

## 2015-10-03 DIAGNOSIS — Z0183 Encounter for blood typing: Secondary | ICD-10-CM | POA: Diagnosis not present

## 2015-10-03 DIAGNOSIS — Z01812 Encounter for preprocedural laboratory examination: Secondary | ICD-10-CM | POA: Insufficient documentation

## 2015-10-03 HISTORY — DX: Unspecified osteoarthritis, unspecified site: M19.90

## 2015-10-03 HISTORY — DX: Essential (primary) hypertension: I10

## 2015-10-03 HISTORY — DX: Cardiac murmur, unspecified: R01.1

## 2015-10-03 LAB — SURGICAL PCR SCREEN
MRSA, PCR: NEGATIVE
Staphylococcus aureus: NEGATIVE

## 2015-10-03 LAB — BASIC METABOLIC PANEL
ANION GAP: 9 (ref 5–15)
BUN: 19 mg/dL (ref 6–20)
CO2: 28 mmol/L (ref 22–32)
Calcium: 9.6 mg/dL (ref 8.9–10.3)
Chloride: 105 mmol/L (ref 101–111)
Creatinine, Ser: 0.96 mg/dL (ref 0.61–1.24)
Glucose, Bld: 97 mg/dL (ref 65–99)
POTASSIUM: 5.1 mmol/L (ref 3.5–5.1)
SODIUM: 142 mmol/L (ref 135–145)

## 2015-10-03 LAB — URINALYSIS, ROUTINE W REFLEX MICROSCOPIC
BILIRUBIN URINE: NEGATIVE
GLUCOSE, UA: NEGATIVE mg/dL
Hgb urine dipstick: NEGATIVE
Ketones, ur: NEGATIVE mg/dL
Leukocytes, UA: NEGATIVE
NITRITE: NEGATIVE
PH: 7 (ref 5.0–8.0)
Protein, ur: NEGATIVE mg/dL
SPECIFIC GRAVITY, URINE: 1.023 (ref 1.005–1.030)

## 2015-10-03 LAB — APTT: APTT: 35 s (ref 24–37)

## 2015-10-03 LAB — PROTIME-INR
INR: 1.02 (ref 0.00–1.49)
PROTHROMBIN TIME: 13.6 s (ref 11.6–15.2)

## 2015-10-03 NOTE — Progress Notes (Signed)
09-13-15 - LOV - Dr. Unk Lightning (PCP) - in chart 09-13-15 - HgA1C (6.5) - in chart 09-13-15 - CBC w/diff - in chart 09-13-15 - CMP - in chart  10-29-13 - EKG - in chart

## 2015-10-03 NOTE — Patient Instructions (Addendum)
Julian Davis  10/03/2015   Your procedure is scheduled on: October 10, 2015  Report to [facility] Main  Entrance take [facility]  elevators to 3rd floor to  [place] at  11:30 AM.  Call this number if you have problems the morning of surgery [phone]   Remember: ONLY 1 PERSON MAY GO WITH YOU TO SHORT STAY TO GET  READY MORNING OF [place].  Do not eat food after midnight Sunday night.  Monday morning may follow clear liquid diet until 7:30 morning of surgery.  Then nothing by mouth.      Take these medicines the morning of surgery with A SIP OF WATER: none  DO NOT TAKE ANY DIABETIC MEDICATIONS DAY OF YOUR SURGERY                               You may not have any metal on your body including hair pins and              piercings  Do not wear jewelry, make-up, lotions, powders or perfumes, deodorant                       Men may shave face and neck.   Do not bring valuables to the hospital. [place].  Contacts, dentures or bridgework may not be worn into surgery.  Leave suitcase in the car. After surgery it may be brought to your room.     Special Instructions: coughing and deep breathing exercises, leg exercises              Please read over the following fact sheets you were given: _____________________________________________________________________             [facility] - Preparing for Surgery Before surgery, you can play an important role.  Because skin is not sterile, your skin needs to be as free of germs as possible.  You can reduce the number of germs on your skin by washing with CHG (chlorahexidine gluconate) soap before surgery.  CHG is an antiseptic cleaner which kills germs and bonds with the skin to continue killing germs even after washing. Please DO NOT use if you have an allergy to CHG or antibacterial soaps.  If your skin becomes reddened/irritated stop using the CHG and  inform your nurse when you arrive at Short Stay. Do not shave (including legs and underarms) for at least 48 hours prior to the first CHG shower.  You may shave your face/neck. Please follow these instructions carefully:  1.  Shower with CHG Soap the night before surgery and the  morning of Surgery.  2.  If you choose to wash your hair, wash your hair first as usual with your  normal  shampoo.  3.  After you shampoo, rinse your hair and body thoroughly to remove the  shampoo.                           4.  Use CHG as you would any other liquid soap.  You can apply chg directly  to the skin and wash  Gently with a scrungie or clean washcloth.  5.  Apply the CHG Soap to your body ONLY FROM THE NECK DOWN.   Do not use on face/ open                           Wound or open sores. Avoid contact with eyes, ears mouth and genitals (private parts).                       Wash face,  Genitals (private parts) with your normal soap.             6.  Wash thoroughly, paying special attention to the area where your surgery  will be performed.  7.  Thoroughly rinse your body with warm water from the neck down.  8.  DO NOT shower/wash with your normal soap after using and rinsing off  the CHG Soap.                9.  Pat yourself dry with a clean towel.            10.  Wear clean pajamas.            11.  Place clean sheets on your bed the night of your first shower and do not  sleep with pets. Day of Surgery : Do not apply any lotions/deodorants the morning of surgery.  Please wear clean clothes to the hospital/surgery center.  FAILURE TO FOLLOW THESE INSTRUCTIONS MAY RESULT IN THE CANCELLATION OF YOUR SURGERY PATIENT SIGNATURE_________________________________  NURSE SIGNATURE__________________________________  ________________________________________________________________________  WHAT IS A BLOOD TRANSFUSION? Blood Transfusion Information  A transfusion is the replacement of blood or  some of its parts. Blood is made up of multiple cells which provide different functions.  Red blood cells carry oxygen and are used for blood loss replacement.  White blood cells fight against infection.  Platelets control bleeding.  Plasma helps clot blood.  Other blood products are available for specialized needs, such as hemophilia or other clotting disorders. BEFORE THE TRANSFUSION  Who gives blood for transfusions?   Healthy volunteers who are fully evaluated to make sure their blood is safe. This is blood bank blood. Transfusion therapy is the safest it has ever been in the practice of medicine. Before blood is taken from a donor, a complete history is taken to make sure that person has no history of diseases nor engages in risky social behavior (examples are intravenous drug use or sexual activity with multiple partners). The donor's travel history is screened to minimize risk of transmitting infections, such as malaria. The donated blood is tested for signs of infectious diseases, such as HIV and hepatitis. The blood is then tested to be sure it is compatible with you in order to minimize the chance of a transfusion reaction. If you or a relative donates blood, this is often done in anticipation of surgery and is not appropriate for emergency situations. It takes many days to process the donated blood. RISKS AND COMPLICATIONS Although transfusion therapy is very safe and saves many lives, the main dangers of transfusion include:  1. Getting an infectious disease. 2. Developing a transfusion reaction. This is an allergic reaction to something in the blood you were given. Every precaution is taken to prevent this. The decision to have a blood transfusion has been considered carefully by your caregiver before blood is given. Blood is not given unless the benefits outweigh  the risks. AFTER THE TRANSFUSION  Right after receiving a blood transfusion, you will usually feel much better and  more energetic. This is especially true if your red blood cells have gotten low (anemic). The transfusion raises the level of the red blood cells which carry oxygen, and this usually causes an energy increase.  The nurse administering the transfusion will monitor you carefully for complications. HOME CARE INSTRUCTIONS  No special instructions are needed after a transfusion. You may find your energy is better. Speak with your caregiver about any limitations on activity for underlying diseases you may have. SEEK MEDICAL CARE IF:   Your condition is not improving after your transfusion.  You develop redness or irritation at the intravenous (IV) site. SEEK IMMEDIATE MEDICAL CARE IF:  Any of the following symptoms occur over the next 12 hours:  Shaking chills.  You have a temperature by mouth above 102 F (38.9 C), not controlled by medicine.  Chest, back, or muscle pain.  People around you feel you are not acting correctly or are confused.  Shortness of breath or difficulty breathing.  Dizziness and fainting.  You get a rash or develop hives.  You have a decrease in urine output.  Your urine turns a dark color or changes to pink, red, or brown. Any of the following symptoms occur over the next 10 days:  You have a temperature by mouth above 102 F (38.9 C), not controlled by medicine.  Shortness of breath.  Weakness after normal activity.  The white part of the eye turns yellow (jaundice).  You have a decrease in the amount of urine or are urinating less often.  Your urine turns a dark color or changes to pink, red, or brown. Document Released: 07/27/2000 Document Revised: 10/22/2011 Document Reviewed: 03/15/2008 ExitCare Patient Information 2014 [place].  _______________________________________________________________________  Incentive Spirometer  An incentive spirometer is a tool that can help keep your lungs clear and active. This tool measures how well you  are filling your lungs with each breath. Taking long deep breaths may help reverse or decrease the chance of developing breathing (pulmonary) problems (especially infection) following:  A long period of time when you are unable to move or be active. BEFORE THE PROCEDURE   If the spirometer includes an indicator to show your best effort, your nurse or respiratory therapist will set it to a desired goal.  If possible, sit up straight or lean slightly forward. Try not to slouch.  Hold the incentive spirometer in an upright position. INSTRUCTIONS FOR USE  3. Sit on the edge of your bed if possible, or sit up as far as you can in bed or on a chair. 4. Hold the incentive spirometer in an upright position. 5. Breathe out normally. 6. Place the mouthpiece in your mouth and seal your lips tightly around it. 7. Breathe in slowly and as deeply as possible, raising the piston or the ball toward the top of the column. 8. Hold your breath for 3-5 seconds or for as long as possible. Allow the piston or ball to fall to the bottom of the column. 9. Remove the mouthpiece from your mouth and breathe out normally. 10. Rest for a few seconds and repeat Steps 1 through 7 at least 10 times every 1-2 hours when you are awake. Take your time and take a few normal breaths between deep breaths. 11. The spirometer may include an indicator to show your best effort. Use the indicator as a goal to work  toward during each repetition. 12. After each set of 10 deep breaths, practice coughing to be sure your lungs are clear. If you have an incision (the cut made at the time of surgery), support your incision when coughing by placing a pillow or rolled up towels firmly against it. Once you are able to get out of bed, walk around indoors and cough well. You may stop using the incentive spirometer when instructed by your caregiver.  RISKS AND COMPLICATIONS  Take your time so you do not get dizzy or light-headed.  If you are in  pain, you may need to take or ask for pain medication before doing incentive spirometry. It is harder to take a deep breath if you are having pain. AFTER USE  Rest and breathe slowly and easily.  It can be helpful to keep track of a log of your progress. Your caregiver can provide you with a simple table to help with this. If you are using the spirometer at home, follow these instructions: [place] IF:   You are having difficultly using the spirometer.  You have trouble using the spirometer as often as instructed.  Your pain medication is not giving enough relief while using the spirometer.  You develop fever of 100.5 F (38.1 C) or higher. SEEK IMMEDIATE MEDICAL CARE IF:   You cough up bloody sputum that had not been present before.  You develop fever of 102 F (38.9 C) or greater.  You develop worsening pain at or near the incision site. MAKE SURE YOU:   Understand these instructions.  Will watch your condition.  Will get help right away if you are not doing well or get worse. Document Released: 12/10/2006 Document Revised: 10/22/2011 Document Reviewed: 02/10/2007 ExitCare Patient Information 2014 ExitCare, [place].   ________________________________________________________________________    CLEAR LIQUID DIET   Foods Allowed                                                                     Foods Excluded  Coffee and tea, regular and decaf                             liquids that you cannot  Plain Jell-O in any flavor                                             see through such as: Fruit ices (not with fruit pulp)                                     milk, soups, orange juice  Iced Popsicles                                    All solid food Carbonated beverages, regular and diet  Cranberry, grape and apple juices Sports drinks like Gatorade Lightly seasoned clear broth or consume(fat free) Sugar, honey syrup  Sample  Menu Breakfast                                Lunch                                     Supper Cranberry juice                    Beef broth                            Chicken broth Jell-O                                     Grape juice                           Apple juice Coffee or tea                        Jell-O                                      Popsicle                                                Coffee or tea                        Coffee or tea  _____________________________________________________________________

## 2015-10-06 NOTE — Progress Notes (Signed)
Final EKG in EPIC done 10/03/15.

## 2015-10-06 NOTE — Progress Notes (Signed)
Patient aware to arrive at 0530am on 10/10/15 and surgery time at 0730.  Also patient aware nothing to eat or drink after midnite nite before surgery.  Patient voiced understanding.

## 2015-10-10 ENCOUNTER — Inpatient Hospital Stay (HOSPITAL_COMMUNITY): Payer: Medicare Other | Admitting: Certified Registered"

## 2015-10-10 ENCOUNTER — Encounter (HOSPITAL_COMMUNITY): Payer: Self-pay | Admitting: *Deleted

## 2015-10-10 ENCOUNTER — Encounter (HOSPITAL_COMMUNITY)
Admission: RE | Disposition: A | Discharge status: Home Health | Payer: Self-pay | Source: Ambulatory Visit | Attending: Orthopedic Surgery

## 2015-10-10 ENCOUNTER — Inpatient Hospital Stay (HOSPITAL_COMMUNITY)
Admission: RE | Admit: 2015-10-10 | Discharge: 2015-10-12 | DRG: 468 | Disposition: A | Discharge status: Home Health | Payer: Medicare Other | Source: Ambulatory Visit | Attending: Orthopedic Surgery | Admitting: Orthopedic Surgery

## 2015-10-10 DIAGNOSIS — M1711 Unilateral primary osteoarthritis, right knee: Secondary | ICD-10-CM | POA: Diagnosis present

## 2015-10-10 DIAGNOSIS — I10 Essential (primary) hypertension: Secondary | ICD-10-CM | POA: Diagnosis present

## 2015-10-10 DIAGNOSIS — Z79899 Other long term (current) drug therapy: Secondary | ICD-10-CM | POA: Diagnosis not present

## 2015-10-10 DIAGNOSIS — Y792 Prosthetic and other implants, materials and accessory orthopedic devices associated with adverse incidents: Secondary | ICD-10-CM | POA: Diagnosis present

## 2015-10-10 DIAGNOSIS — T84092A Other mechanical complication of internal right knee prosthesis, initial encounter: Principal | ICD-10-CM | POA: Diagnosis present

## 2015-10-10 DIAGNOSIS — T8489XA Other specified complication of internal orthopedic prosthetic devices, implants and grafts, initial encounter: Secondary | ICD-10-CM | POA: Diagnosis not present

## 2015-10-10 DIAGNOSIS — Z01812 Encounter for preprocedural laboratory examination: Secondary | ICD-10-CM

## 2015-10-10 DIAGNOSIS — M179 Osteoarthritis of knee, unspecified: Secondary | ICD-10-CM | POA: Diagnosis not present

## 2015-10-10 DIAGNOSIS — Z96651 Presence of right artificial knee joint: Secondary | ICD-10-CM | POA: Diagnosis not present

## 2015-10-10 DIAGNOSIS — Z96659 Presence of unspecified artificial knee joint: Secondary | ICD-10-CM

## 2015-10-10 HISTORY — PX: CONVERSION TO TOTAL KNEE: SHX5785

## 2015-10-10 LAB — TYPE AND SCREEN
ABO/RH(D): A POS
ANTIBODY SCREEN: NEGATIVE

## 2015-10-10 SURGERY — CONVERSION, ARTHROPLASTY, KNEE, PARTIAL, TO TOTAL KNEE ARTHROPLASTY
Anesthesia: Monitor Anesthesia Care | Site: Knee | Laterality: Right

## 2015-10-10 MED ORDER — ONDANSETRON HCL 4 MG/2ML IJ SOLN
INTRAMUSCULAR | Status: DC | PRN
  Administered 2015-10-10 (×2): 2 mg via INTRAVENOUS

## 2015-10-10 MED ORDER — ACETAMINOPHEN 325 MG PO TABS: 325.0000 mg | ORAL_TABLET | ORAL | Status: DC | PRN

## 2015-10-10 MED ORDER — HYDROMORPHONE HCL 1 MG/ML IJ SOLN: 0.2500 mg | INTRAMUSCULAR | Status: DC | PRN

## 2015-10-10 MED ORDER — CEFAZOLIN SODIUM-DEXTROSE 2-3 GM-% IV SOLR
2.0000 g | INTRAVENOUS | Status: AC
  Administered 2015-10-10: 2 g via INTRAVENOUS

## 2015-10-10 MED ORDER — LIDOCAINE HCL (CARDIAC) 20 MG/ML IV SOLN
INTRAVENOUS | Status: AC
  Filled 2015-10-10: qty 5

## 2015-10-10 MED ORDER — AMLODIPINE BESYLATE 5 MG PO TABS
5.0000 mg | ORAL_TABLET | Freq: Once | ORAL | Status: DC
  Filled 2015-10-10: qty 1

## 2015-10-10 MED ORDER — ONDANSETRON HCL 4 MG/2ML IJ SOLN: 4.0000 mg | Freq: Four times a day (QID) | INTRAMUSCULAR | Status: DC | PRN

## 2015-10-10 MED ORDER — PROPOFOL 10 MG/ML IV BOLUS
INTRAVENOUS | Status: AC
  Filled 2015-10-10: qty 20

## 2015-10-10 MED ORDER — PROPOFOL 10 MG/ML IV BOLUS
INTRAVENOUS | Status: DC | PRN
  Administered 2015-10-10 (×3): 30 mg via INTRAVENOUS

## 2015-10-10 MED ORDER — PROPOFOL 10 MG/ML IV BOLUS
INTRAVENOUS | Status: AC
  Filled 2015-10-10: qty 40

## 2015-10-10 MED ORDER — LACTATED RINGERS IV SOLN
INTRAVENOUS | Status: DC | PRN
  Administered 2015-10-10 (×3): via INTRAVENOUS

## 2015-10-10 MED ORDER — METOCLOPRAMIDE HCL 10 MG PO TABS: 5.0000 mg | ORAL_TABLET | Freq: Three times a day (TID) | ORAL | Status: DC | PRN

## 2015-10-10 MED ORDER — CHLORHEXIDINE GLUCONATE 4 % EX LIQD: 60.0000 mL | Freq: Once | CUTANEOUS | Status: DC

## 2015-10-10 MED ORDER — SODIUM CHLORIDE 0.9 % IV SOLN
INTRAVENOUS | Status: DC
  Administered 2015-10-10: 12:00:00 via INTRAVENOUS
  Filled 2015-10-10 (×6): qty 1000

## 2015-10-10 MED ORDER — METOCLOPRAMIDE HCL 5 MG/ML IJ SOLN: 5.0000 mg | Freq: Three times a day (TID) | INTRAMUSCULAR | Status: DC | PRN

## 2015-10-10 MED ORDER — CEFAZOLIN SODIUM-DEXTROSE 2-3 GM-% IV SOLR
2.0000 g | Freq: Four times a day (QID) | INTRAVENOUS | Status: AC
  Administered 2015-10-10 (×2): 2 g via INTRAVENOUS
  Filled 2015-10-10 (×2): qty 50

## 2015-10-10 MED ORDER — ALUM & MAG HYDROXIDE-SIMETH 200-200-20 MG/5ML PO SUSP: 30.0000 mL | ORAL | Status: DC | PRN

## 2015-10-10 MED ORDER — TAMSULOSIN HCL 0.4 MG PO CAPS
0.4000 mg | ORAL_CAPSULE | Freq: Every day | ORAL | Status: DC
  Administered 2015-10-10 – 2015-10-11 (×2): 0.4 mg via ORAL
  Filled 2015-10-10 (×3): qty 1

## 2015-10-10 MED ORDER — BUPIVACAINE IN DEXTROSE 0.75-8.25 % IT SOLN
INTRATHECAL | Status: DC | PRN
  Administered 2015-10-10: 2 mL via INTRATHECAL

## 2015-10-10 MED ORDER — DIPHENHYDRAMINE HCL 25 MG PO CAPS: 25.0000 mg | ORAL_CAPSULE | Freq: Four times a day (QID) | ORAL | Status: DC | PRN

## 2015-10-10 MED ORDER — BISACODYL 10 MG RE SUPP: 10.0000 mg | Freq: Every day | RECTAL | Status: DC | PRN

## 2015-10-10 MED ORDER — METHOCARBAMOL 500 MG PO TABS
500.0000 mg | ORAL_TABLET | Freq: Four times a day (QID) | ORAL | Status: DC | PRN
  Administered 2015-10-11 – 2015-10-12 (×2): 500 mg via ORAL
  Filled 2015-10-10 (×2): qty 1

## 2015-10-10 MED ORDER — MENTHOL 3 MG MT LOZG: 1.0000 | LOZENGE | OROMUCOSAL | Status: DC | PRN

## 2015-10-10 MED ORDER — OXYCODONE HCL 5 MG PO TABS
5.0000 mg | ORAL_TABLET | Freq: Once | ORAL | Status: DC | PRN
Start: 2015-10-10 — End: 2015-10-10

## 2015-10-10 MED ORDER — BUPIVACAINE-EPINEPHRINE (PF) 0.25% -1:200000 IJ SOLN
INTRAMUSCULAR | Status: DC | PRN
  Administered 2015-10-10: 30 mL

## 2015-10-10 MED ORDER — SODIUM CHLORIDE 0.9 % IJ SOLN
INTRAMUSCULAR | Status: DC | PRN
  Administered 2015-10-10: 30 mL

## 2015-10-10 MED ORDER — POLYETHYLENE GLYCOL 3350 17 G PO PACK
17.0000 g | PACK | Freq: Two times a day (BID) | ORAL | Status: DC
  Administered 2015-10-10 – 2015-10-12 (×4): 17 g via ORAL

## 2015-10-10 MED ORDER — DEXAMETHASONE SODIUM PHOSPHATE 10 MG/ML IJ SOLN
10.0000 mg | Freq: Once | INTRAMUSCULAR | Status: AC
  Administered 2015-10-11: 10 mg via INTRAVENOUS
  Filled 2015-10-10: qty 1

## 2015-10-10 MED ORDER — ACETAMINOPHEN 160 MG/5ML PO SOLN: 325.0000 mg | ORAL | Status: DC | PRN

## 2015-10-10 MED ORDER — OXYCODONE HCL 5 MG/5ML PO SOLN
5.0000 mg | Freq: Once | ORAL | Status: DC | PRN
  Filled 2015-10-10: qty 5

## 2015-10-10 MED ORDER — MIDAZOLAM HCL 2 MG/2ML IJ SOLN
INTRAMUSCULAR | Status: AC
  Filled 2015-10-10: qty 2

## 2015-10-10 MED ORDER — PHENYLEPHRINE 40 MCG/ML (10ML) SYRINGE FOR IV PUSH (FOR BLOOD PRESSURE SUPPORT)
PREFILLED_SYRINGE | INTRAVENOUS | Status: AC
  Filled 2015-10-10: qty 10

## 2015-10-10 MED ORDER — EPHEDRINE SULFATE 50 MG/ML IJ SOLN
INTRAMUSCULAR | Status: DC | PRN
  Administered 2015-10-10: 5 mg via INTRAVENOUS

## 2015-10-10 MED ORDER — DEXAMETHASONE SODIUM PHOSPHATE 10 MG/ML IJ SOLN
10.0000 mg | Freq: Once | INTRAMUSCULAR | Status: AC
  Administered 2015-10-10: 10 mg via INTRAVENOUS

## 2015-10-10 MED ORDER — ONDANSETRON HCL 4 MG PO TABS: 4.0000 mg | ORAL_TABLET | Freq: Four times a day (QID) | ORAL | Status: DC | PRN

## 2015-10-10 MED ORDER — EPHEDRINE SULFATE 50 MG/ML IJ SOLN
INTRAMUSCULAR | Status: AC
  Filled 2015-10-10: qty 1

## 2015-10-10 MED ORDER — DOCUSATE SODIUM 100 MG PO CAPS
100.0000 mg | ORAL_CAPSULE | Freq: Two times a day (BID) | ORAL | Status: DC
  Administered 2015-10-10 – 2015-10-12 (×5): 100 mg via ORAL

## 2015-10-10 MED ORDER — HYDROMORPHONE HCL 1 MG/ML IJ SOLN
0.5000 mg | INTRAMUSCULAR | Status: DC | PRN
  Administered 2015-10-10: 0.5 mg via INTRAVENOUS
  Administered 2015-10-10: 1 mg via INTRAVENOUS
  Administered 2015-10-11: 0.5 mg via INTRAVENOUS
  Filled 2015-10-10 (×3): qty 1

## 2015-10-10 MED ORDER — MIDAZOLAM HCL 5 MG/5ML IJ SOLN
INTRAMUSCULAR | Status: DC | PRN
  Administered 2015-10-10: 2 mg via INTRAVENOUS

## 2015-10-10 MED ORDER — KETOROLAC TROMETHAMINE 30 MG/ML IJ SOLN
INTRAMUSCULAR | Status: DC | PRN
  Administered 2015-10-10: 30 mg

## 2015-10-10 MED ORDER — RIVAROXABAN 10 MG PO TABS
10.0000 mg | ORAL_TABLET | ORAL | Status: DC
  Administered 2015-10-11 – 2015-10-12 (×2): 10 mg via ORAL
  Filled 2015-10-10 (×3): qty 1

## 2015-10-10 MED ORDER — DEXAMETHASONE SODIUM PHOSPHATE 10 MG/ML IJ SOLN
INTRAMUSCULAR | Status: AC
Start: 2015-10-10 — End: 2015-10-10
  Filled 2015-10-10: qty 1

## 2015-10-10 MED ORDER — SODIUM CHLORIDE 0.9 % IJ SOLN
INTRAMUSCULAR | Status: AC
  Filled 2015-10-10: qty 50

## 2015-10-10 MED ORDER — OXYCODONE HCL 5 MG/5ML PO SOLN: 5.0000 mg | Freq: Once | ORAL | Status: DC | PRN

## 2015-10-10 MED ORDER — PHENYLEPHRINE HCL 10 MG/ML IJ SOLN
INTRAMUSCULAR | Status: DC | PRN
  Administered 2015-10-10: 40 ug via INTRAVENOUS
  Administered 2015-10-10: 80 ug via INTRAVENOUS
  Administered 2015-10-10 (×2): 40 ug via INTRAVENOUS
  Administered 2015-10-10 (×5): 80 ug via INTRAVENOUS
  Administered 2015-10-10: 40 ug via INTRAVENOUS

## 2015-10-10 MED ORDER — SODIUM CHLORIDE 0.9 % IJ SOLN
INTRAMUSCULAR | Status: AC
Start: 2015-10-10 — End: 2015-10-10
  Filled 2015-10-10: qty 10

## 2015-10-10 MED ORDER — SODIUM CHLORIDE 0.9 % IR SOLN
Status: DC | PRN
  Administered 2015-10-10: 1000 mL

## 2015-10-10 MED ORDER — ONDANSETRON HCL 4 MG/2ML IJ SOLN
INTRAMUSCULAR | Status: AC
  Filled 2015-10-10: qty 2

## 2015-10-10 MED ORDER — CEFAZOLIN SODIUM-DEXTROSE 2-3 GM-% IV SOLR
INTRAVENOUS | Status: AC
  Filled 2015-10-10: qty 50

## 2015-10-10 MED ORDER — PROPOFOL 500 MG/50ML IV EMUL
INTRAVENOUS | Status: DC | PRN
  Administered 2015-10-10: 75 ug/kg/min via INTRAVENOUS

## 2015-10-10 MED ORDER — BUPIVACAINE-EPINEPHRINE (PF) 0.25% -1:200000 IJ SOLN
INTRAMUSCULAR | Status: AC
  Filled 2015-10-10: qty 30

## 2015-10-10 MED ORDER — OXYCODONE HCL 5 MG PO TABS: 5.0000 mg | ORAL_TABLET | Freq: Once | ORAL | Status: DC | PRN

## 2015-10-10 MED ORDER — METHOCARBAMOL 1000 MG/10ML IJ SOLN
500.0000 mg | Freq: Four times a day (QID) | INTRAVENOUS | Status: DC | PRN
  Administered 2015-10-10: 500 mg via INTRAVENOUS
  Filled 2015-10-10 (×2): qty 5

## 2015-10-10 MED ORDER — KETOROLAC TROMETHAMINE 30 MG/ML IJ SOLN
INTRAMUSCULAR | Status: AC
  Filled 2015-10-10: qty 1

## 2015-10-10 MED ORDER — SODIUM CHLORIDE 0.9 % IV SOLN
1000.0000 mg | Freq: Once | INTRAVENOUS | Status: AC
  Administered 2015-10-10: 1000 mg via INTRAVENOUS
  Filled 2015-10-10: qty 10

## 2015-10-10 MED ORDER — MAGNESIUM CITRATE PO SOLN: 1.0000 | Freq: Once | ORAL | Status: DC | PRN

## 2015-10-10 MED ORDER — 0.9 % SODIUM CHLORIDE (POUR BTL) OPTIME
TOPICAL | Status: DC | PRN
  Administered 2015-10-10: 1000 mL

## 2015-10-10 MED ORDER — HYDROCODONE-ACETAMINOPHEN 7.5-325 MG PO TABS
1.0000 | ORAL_TABLET | ORAL | Status: DC
  Administered 2015-10-10 – 2015-10-11 (×7): 2 via ORAL
  Administered 2015-10-11: 1 via ORAL
  Administered 2015-10-11 – 2015-10-12 (×4): 2 via ORAL
  Filled 2015-10-10 (×12): qty 2

## 2015-10-10 MED ORDER — PHENOL 1.4 % MT LIQD: 1.0000 | OROMUCOSAL | Status: DC | PRN

## 2015-10-10 MED ORDER — LIDOCAINE HCL (CARDIAC) 20 MG/ML IV SOLN
INTRAVENOUS | Status: DC | PRN
  Administered 2015-10-10: 40 mg via INTRAVENOUS

## 2015-10-10 MED ORDER — FERROUS SULFATE 325 (65 FE) MG PO TABS
325.0000 mg | ORAL_TABLET | Freq: Three times a day (TID) | ORAL | Status: DC
  Administered 2015-10-11 – 2015-10-12 (×5): 325 mg via ORAL
  Filled 2015-10-10 (×7): qty 1

## 2015-10-10 MED FILL — Sodium Chloride IV Soln 0.9%: INTRAVENOUS | Qty: 1000 | Status: AC

## 2015-10-10 SURGICAL SUPPLY — 55 items
BAG ZIPLOCK 12X15 (MISCELLANEOUS) ×3 IMPLANT
BANDAGE ACE 6X5 VEL STRL LF (GAUZE/BANDAGES/DRESSINGS) ×3 IMPLANT
BANDAGE ESMARK 6X9 LF (GAUZE/BANDAGES/DRESSINGS) ×1 IMPLANT
BLADE SAW SGTL 13.0X1.19X90.0M (BLADE) ×3 IMPLANT
BNDG ESMARK 6X9 LF (GAUZE/BANDAGES/DRESSINGS) ×3
BOWL SMART MIX CTS (DISPOSABLE) ×3 IMPLANT
CAP KNEE TOTAL 3 SIGMA ×3 IMPLANT
CEMENT HV SMART SET (Cement) ×6 IMPLANT
CUFF TOURN SGL QUICK 34 (TOURNIQUET CUFF) ×2
CUFF TRNQT CYL 34X4X40X1 (TOURNIQUET CUFF) ×1 IMPLANT
DECANTER SPIKE VIAL GLASS SM (MISCELLANEOUS) ×3 IMPLANT
DRAPE EXTREMITY T 121X128X90 (DRAPE) ×3 IMPLANT
DRAPE POUCH INSTRU U-SHP 10X18 (DRAPES) ×3 IMPLANT
DRAPE U-SHAPE 47X51 STRL (DRAPES) ×3 IMPLANT
DRSG AQUACEL AG ADV 3.5X10 (GAUZE/BANDAGES/DRESSINGS) ×3 IMPLANT
DRSG TEGADERM 4X4.75 (GAUZE/BANDAGES/DRESSINGS) IMPLANT
DURAPREP 26ML APPLICATOR (WOUND CARE) ×6 IMPLANT
ELECT REM PT RETURN 9FT ADLT (ELECTROSURGICAL) ×3
ELECTRODE REM PT RTRN 9FT ADLT (ELECTROSURGICAL) ×1 IMPLANT
FACESHIELD WRAPAROUND (MASK) ×15 IMPLANT
GAUZE SPONGE 2X2 8PLY STRL LF (GAUZE/BANDAGES/DRESSINGS) IMPLANT
GLOVE BIOGEL M 7.0 STRL (GLOVE) IMPLANT
GLOVE BIOGEL PI IND STRL 7.5 (GLOVE) ×3 IMPLANT
GLOVE BIOGEL PI IND STRL 8.5 (GLOVE) ×3 IMPLANT
GLOVE BIOGEL PI INDICATOR 7.5 (GLOVE) ×6
GLOVE BIOGEL PI INDICATOR 8.5 (GLOVE) ×6
GLOVE ECLIPSE 8.0 STRL XLNG CF (GLOVE) ×3 IMPLANT
GLOVE ORTHO TXT STRL SZ7.5 (GLOVE) ×6 IMPLANT
GOWN STRL REUS W/TWL LRG LVL3 (GOWN DISPOSABLE) ×3 IMPLANT
GOWN STRL REUS W/TWL XL LVL3 (GOWN DISPOSABLE) ×9 IMPLANT
HANDPIECE INTERPULSE COAX TIP (DISPOSABLE) ×2
LIQUID BAND (GAUZE/BANDAGES/DRESSINGS) ×3 IMPLANT
MANIFOLD NEPTUNE II (INSTRUMENTS) ×3 IMPLANT
NDL SAFETY ECLIPSE 18X1.5 (NEEDLE) ×1 IMPLANT
NEEDLE HYPO 18GX1.5 SHARP (NEEDLE) ×2
NS IRRIG 1000ML POUR BTL (IV SOLUTION) ×6 IMPLANT
POSITIONER SURGICAL ARM (MISCELLANEOUS) ×3 IMPLANT
SET HNDPC FAN SPRY TIP SCT (DISPOSABLE) ×1 IMPLANT
SET PAD KNEE POSITIONER (MISCELLANEOUS) ×3 IMPLANT
SPONGE GAUZE 2X2 STER 10/PKG (GAUZE/BANDAGES/DRESSINGS)
SUCTION FRAZIER HANDLE 12FR (TUBING) ×2
SUCTION TUBE FRAZIER 12FR DISP (TUBING) ×1 IMPLANT
SUT MNCRL AB 4-0 PS2 18 (SUTURE) ×3 IMPLANT
SUT VIC AB 1 CT1 36 (SUTURE) ×3 IMPLANT
SUT VIC AB 2-0 CT1 27 (SUTURE) ×6
SUT VIC AB 2-0 CT1 TAPERPNT 27 (SUTURE) ×3 IMPLANT
SUT VLOC 180 0 24IN GS25 (SUTURE) ×3 IMPLANT
SYR 50ML LL SCALE MARK (SYRINGE) ×3 IMPLANT
TOWEL OR 17X26 10 PK STRL BLUE (TOWEL DISPOSABLE) ×6 IMPLANT
TRAY FOLEY W/METER SILVER 14FR (SET/KITS/TRAYS/PACK) IMPLANT
TRAY FOLEY W/METER SILVER 16FR (SET/KITS/TRAYS/PACK) ×3 IMPLANT
TRAY REVISION SZ 3 (Knees) ×3 IMPLANT
WATER STERILE IRR 1500ML POUR (IV SOLUTION) ×3 IMPLANT
WRAP KNEE MAXI GEL POST OP (GAUZE/BANDAGES/DRESSINGS) ×3 IMPLANT
YANKAUER SUCT BULB TIP 10FT TU (MISCELLANEOUS) IMPLANT

## 2015-10-10 NOTE — Progress Notes (Signed)
Utilization review completed.  

## 2015-10-10 NOTE — Transfer of Care (Signed)
Immediate Anesthesia Transfer of Care Note  Patient: Julian Davis  Procedure(s) Performed: Procedure(s): CONVERSION OF RIGHT UNI KNEE TO RIGHT TOTAL KNEE ARTHROPLASTY (Right)  Patient Location: PACU  Anesthesia Type:Spinal  Level of Consciousness:  sedated, patient cooperative and responds to stimulation  Airway & Oxygen Therapy:Patient Spontanous Breathing and Patient connected to face mask oxgen  Post-op Assessment:  Report given to PACU RN and Post -op Vital signs reviewed and stable  Post vital signs:  Reviewed and stable  Last Vitals:  Filed Vitals:   10/10/15 0518 10/10/15 0945  BP: 145/74 121/67  Pulse: 94 74  Temp: 36.3 C   Resp: 17 11    Complications: No apparent anesthesia complications

## 2015-10-10 NOTE — Interval H&P Note (Signed)
History and Physical Interval Note:  10/10/2015 7:16 AM  Julian Davis  has presented today for surgery, with the diagnosis of failed right uni knee, secondary to progressive DJD, Patella femoral and lateral  The various methods of treatment have been discussed with the patient and family. After consideration of risks, benefits and other options for treatment, the patient has consented to  Procedure(s): CONVERSION OF RIGHT UNI KNEE TO RIGHT TOTAL KNEE ARTHROPLASTY (Right) as a surgical intervention .  The patient's history has been reviewed, patient examined, no change in status, stable for surgery.  I have reviewed the patient's chart and labs.  Questions were answered to the patient's satisfaction.     Mauri Pole

## 2015-10-10 NOTE — Anesthesia Preprocedure Evaluation (Signed)
Anesthesia Evaluation  Patient identified by MRN, date of birth, ID band Patient awake    Reviewed: Allergy & Precautions, NPO status , Patient's Chart, lab work & pertinent test results  History of Anesthesia Complications Negative for: history of anesthetic complications  Airway Mallampati: IV  TM Distance: <3 FB Neck ROM: Full    Dental  (+) Teeth Intact   Pulmonary neg pulmonary ROS,    breath sounds clear to auscultation       Cardiovascular hypertension, Pt. on medications (-) angina(-) CHF + Valvular Problems/Murmurs MVP  Rhythm:Regular     Neuro/Psych negative neurological ROS  negative psych ROS   GI/Hepatic negative GI ROS, Neg liver ROS,   Endo/Other  negative endocrine ROS  Renal/GU negative Renal ROS     Musculoskeletal  (+) Arthritis ,   Abdominal   Peds  Hematology negative hematology ROS (+)   Anesthesia Other Findings   Reproductive/Obstetrics                             Anesthesia Physical Anesthesia Plan  ASA: II  Anesthesia Plan: MAC and Spinal   Post-op Pain Management:    Induction:   Airway Management Planned: Natural Airway, Nasal Cannula and Simple Face Mask  Additional Equipment: None  Intra-op Plan:   Post-operative Plan:   Informed Consent: I have reviewed the patients History and Physical, chart, labs and discussed the procedure including the risks, benefits and alternatives for the proposed anesthesia with the patient or authorized representative who has indicated his/her understanding and acceptance.   Dental advisory given  Plan Discussed with: CRNA and Surgeon  Anesthesia Plan Comments:         Anesthesia Quick Evaluation

## 2015-10-10 NOTE — Op Note (Signed)
[name], [name] NO.:  [id number]  MEDICAL RECORD NO.:  [id number]  LOCATION:  [id number]                         FACILITY:  [facility]  PHYSICIAN:  Pietro Cassis. Alvan Dame, M.D.  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  PROCEDURE:  Conversion to a right total knee arthroplasty.  COMPONENTS USED:  Size 3 femur, size 3 MBT revision tray and size 12.5 posterior stabilized insert with a 41 patellar button.  SURGEON:  Pietro Cassis. Alvan Dame, M.D.  ASSISTANT:  Danae Orleans, PA-C.  Note that [name] was present for the entirety of the case from preoperative position, perioperative management of the operative extremity, general facilitation of the case and primary wound closure.  ANESTHESIA:  Spinal.  SPECIMENS:  None.  COMPLICATIONS:  None.  DRAINS:  None.  TOURNIQUET TIME:  37 minutes at 250 mmHg.  INDICATIONS FOR PROCEDURE:  Julian Davis is a very pleasant 82 year old male, patient of mine from previously-performed right partial knee arthroplasty.  He had done exceptionally well until the past year or so when he was begin to have progressive discomfort in his knee. Radiographs had indicated progressive degenerative changes laterally as well as the patellofemoral compartment.  After attempts to try to manage this conservatively with activity modification, medications and injections.  He, at this point, is ready to proceed with more definitive options; despite his age of 51, he wished to remain as active as possible.  Risks of infection, DVT, component failure, need for  future revision surgery all discussed and reviewed.  Consent was obtained for benefit of pain relief.  PROCEDURE IN DETAIL:  The patient was brought to the operative theater. Once adequate anesthesia, preoperative antibiotics, Ancef 2 g, 1 g of tranexamic acid and 10 mg of Decadron administered, he was positioned supine with a right thigh tourniquet and DeMayo leg holder utilized. The right lower extremity was then prepped and draped in sterile fashion.  Time-out was performed identifying the patient, planned procedure and extremity.  The leg was exsanguinated.  Tourniquet elevated to 250 mmHg.  The patient's old incision was excised and then extended proximal and distal for exposure purposes.  Soft tissue planes created.  Median arthrotomy was made encountering clear synovial fluid.  Once the knee was initially exposed, attention was first directed to the patella, precut measurement of the patella was noted to be 23 mm, I resected down to about 14 mm, 41 patellar button to restore height.  The measured height after which was 24-25 mm.  Once this was done, a metal Shim was placed on the patella and was retracted and subluxated laterally without eversion.  At this point, preparation of the bone was carried out as if there was no other components placed.  The intramedullary canal of the femur was opened and irrigated to try to prevent fat emboli.  The distal cutting block  was then positioned at 5 degrees of valgus, resecting 10 mm of bone for the Sigma knee system.  Once it was pinned into place, the distal femoral cut was made.  During this time, I removed the femoral component.  The femoral component was removed without bone loss.  Following the distal femoral cut, subluxated the tibia anteriorly.  With the extramedullary guide in place, I resected just underneath the portion of the tibial tray again removing minimal bone other than that with the prosthesis.  When the tibial  component was removed, a section of bone removed in the keel area where there was cement removed with the component itself.  Once this was removed, I finished up the cut of the proximal tibia. Given this defect, I elected at this point to use an MBT revision tray to provide more cement benefit.  Once the tibial cut was made, we confirmed the gap would be open for 10- mm spacer.  I also confirmed that the cut was perpendicular to coronal plane and parallel to the tibial spine.  Once this was done, I sized the femur to be a size 3.  The size 3 rotation block was then pinned into position.  The 4-in-1 cutting block was then positioned; anterior, posterior and chamfer cuts were then made without notching and without findings or concerns for needs of any wedges in the distal or posterior aspect of the femoral component on the medial side.  Following this, the box cut was made off the lateral aspect of distal femur.  At this point, the tibia was subluxated anteriorly.  Final preparation of the tibia was carried out with size 3 MBT tray, pinned into position and then drilled and then the keel punch made.  At this point, we did a trial reduction with the 3 femur, 3 MBT revision tray in place, and 10 and then 12.5 insert noting the knee came to full extension and the patella tracked through the trochlea without application of pressure.  Given these findings, we removed the trial components.  The synovial capsular junction of the knee was injected with 0.25% Marcaine with epinephrine 30 mL, 1 mL of Toradol and 30 mL of normal saline.  The knee was then irrigated with normal saline solution.  The final components were opened.  The final components were then cemented onto the cut surface that had been cleaned and dried with irrigation.  The knee was brought to extension with a 12.5 insert.  Once the cement had cured, an excessive cement was removed throughout the knee.  The trial polyethylene  insert was removed and then the final 12.5 posterior stabilized insert was placed onto the tibial tray.  The knee was irrigated throughout the case and again at this point.  We then reapproximated the extensor mechanism with the knee in flexion approximately 60 degrees.  The remainder of the wound was closed with 2- 0 Vicryl and running 4-0 Monocryl.  The knee was cleaned, dried and dressed sterilely using surgical glue, Aquacel dressing.  The knee was wrapped in Ace wrap and he was brought to the recovery room in stable condition tolerating the procedure well.  Findings were reviewed with his family friends as his wife was home sick.  He will be in the hospital most likely overnight and home planned for tomorrow.  He will be weightbearing as tolerated, exercises through therapy.     Pietro Cassis Alvan Dame, M.D.     MDO/MEDQ  D:  10/10/2015  T:  10/10/2015  Job:  [id number]

## 2015-10-10 NOTE — Anesthesia Procedure Notes (Addendum)
Spinal Patient location during procedure: OR Start time: 10/10/2015 7:44 AM End time: 10/10/2015 7:47 AM Reason for block: at surgeon's request Staffing Anesthesiologist: Mariaclara Spear Performed by: anesthesiologist  Preanesthetic Checklist Completed: patient identified, site marked, surgical consent, pre-op evaluation, timeout performed, IV checked, risks and benefits discussed, monitors and equipment checked and at surgeon's request Spinal Block Patient position: sitting Prep: ChloraPrep Patient monitoring: heart rate, continuous pulse ox and blood pressure Approach: midline Location: L4-5 Injection technique: single-shot Needle Needle type: Spinocan  Needle gauge: 22 G Needle length: 9 cm Assessment Sensory level: T6 Additional Notes Expiration date of kit checked and confirmed. Patient tolerated procedure well, without complications.  Multiple attempts via CRNA and unable to confirm placement with CSF followed by Anesthesiologist X 1 attempt with noted clear CSF return. Loss of motor and sensory on exam post injection.

## 2015-10-10 NOTE — Brief Op Note (Signed)
10/10/2015  2:59 PM  PATIENT:  Julian Davis  82 y.o. male  PRE-OPERATIVE DIAGNOSIS:  FAILED RIGHT knee unicompartmental knee replacement secondary to DEGENERATIVE JOINT DISEASE  POST-OPERATIVE DIAGNOSIS:  FAILED RIGHT Unicompartmental KNEE replacement secondary to DEGENERATIVE JOINT DISEASE  PROCEDURE:  Procedure(s): CONVERSION OF RIGHT UNI KNEE TO RIGHT TOTAL KNEE ARTHROPLASTY (Right)  SURGEON:  Surgeon(s) and Role:    * Paralee Cancel, MD - Primary  PHYSICIAN ASSISTANT: Danae Orleans, PA-C  ANESTHESIA:   spinal  EBL:  Total I/O In: 2200 [I.V.:2200] Out: 950 [Urine:900; Blood:50]  BLOOD ADMINISTERED:none  DRAINS: none   LOCAL MEDICATIONS USED:  MARCAINE     SPECIMEN:  No Specimen  DISPOSITION OF SPECIMEN:  N/A  COUNTS:  YES  TOURNIQUET:   Total Tourniquet Time Documented: Thigh (Right) - 37 minutes Total: Thigh (Right) - 37 minutes   DICTATION: .Other Dictation: Dictation Number [id number]  PLAN OF CARE: Admit to inpatient   PATIENT DISPOSITION:  PACU - hemodynamically stable.   Delay start of Pharmacological VTE agent (>24hrs) due to surgical blood loss or risk of bleeding: no

## 2015-10-10 NOTE — Evaluation (Signed)
Physical Therapy Evaluation Patient Details Name: Julian Davis MRN: [id number] DOB: [birth year] Today's Date: 10/10/2015   History of Present Illness  82 yo male s/p conversion R uni-knee to TKA 10/10/15.   Clinical Impression  On eval POD 0, pt was Min guard assist for mobility-walked ~115 feet with RW. Pain rated 5/10 at rest, 8/10 with activity. Anticipate pt will progress well during hospital stay.     Follow Up Recommendations Home health PT    Equipment Recommendations  None recommended by PT    Recommendations for Other Services       Precautions / Restrictions Precautions Precautions: Knee Restrictions Weight Bearing Restrictions: No      Mobility  Bed Mobility Overal bed mobility: Needs Assistance Bed Mobility: Supine to Sit;Sit to Supine     Supine to sit: Supervision Sit to supine: Supervision   General bed mobility comments: for lines, safety  Transfers Overall transfer level: Needs assistance Equipment used: Rolling walker (2 wheeled) Transfers: Sit to/from Stand Sit to Stand: Supervision         General transfer comment: VCs hand placement.   Ambulation/Gait Ambulation/Gait assistance: Min guard Ambulation Distance (Feet): 115 Feet Assistive device: Rolling walker (2 wheeled) Gait Pattern/deviations: Step-through pattern;Decreased stride length     General Gait Details: close guard for safety. Pt rated pain 8/10.   Stairs            Wheelchair Mobility    Modified Rankin (Stroke Patients Only)       Balance                                             Pertinent Vitals/Pain Pain Assessment: 0-10 Pain Score: 5  Pain Descriptors / Indicators: Sore Pain Intervention(s): Premedicated before session;Monitored during session;Repositioned    Home Living Family/patient expects to be discharged to:: Private residence Living Arrangements: Spouse/significant other   Type of Home: House Home Access: Stairs to  enter   Technical brewer of Steps: 1 Home Layout: One level;Laundry or work area in [place]: Environmental consultant - 2 wheels;Cane - single point;Crutches;Bedside commode      Prior Function Level of Independence: Independent               Hand Dominance        Extremity/Trunk Assessment   Upper Extremity Assessment: Overall WFL for tasks assessed           Lower Extremity Assessment: RLE deficits/detail RLE Deficits / Details: pt able to SLR. moves ankle well    Cervical / Trunk Assessment: Normal  Communication   Communication: No difficulties  Cognition Arousal/Alertness: Awake/alert Behavior During Therapy: WFL for tasks assessed/performed Overall Cognitive Status: Within Functional Limits for tasks assessed                      General Comments      Exercises        Assessment/Plan    PT Assessment Patient needs continued PT services  PT Diagnosis Difficulty walking;Acute pain   PT Problem List Decreased strength;Decreased range of motion;Decreased activity tolerance;Decreased balance;Decreased mobility;Decreased knowledge of use of DME  PT Treatment Interventions DME instruction;Gait training;Stair training;Functional mobility training;Therapeutic activities;Patient/family education;Balance training;Therapeutic exercise   PT Goals (Current goals can be found in the Care Plan section) Acute Rehab PT Goals Patient Stated Goal: to regain PLOF. less pain PT  Goal Formulation: With patient Time For Goal Achievement: 10/17/15 Potential to Achieve Goals: Good    Frequency 7X/week   Barriers to discharge        Co-evaluation               End of Session Equipment Utilized During Treatment: Gait belt Activity Tolerance: Patient tolerated treatment well Patient left: in bed;with call bell/phone within reach;with bed alarm set           Time: 1500-1508 PT Time Calculation (min) (ACUTE ONLY): 8 min   Charges:   PT  Evaluation $PT Eval Low Complexity: 1 Procedure     PT G Codes:        Weston Anna, MPT Pager: [phone]

## 2015-10-10 NOTE — Discharge Instructions (Addendum)
INSTRUCTIONS AFTER JOINT REPLACEMENT  ° °o Remove items at home which could result in a fall. This includes throw rugs or furniture in walking pathways °o ICE to the affected joint every three hours while awake for 30 minutes at a time, for at least the first 3-5 days, and then as needed for pain and swelling.  Continue to use ice for pain and swelling. You may notice swelling that will progress down to the foot and ankle.  This is normal after surgery.  Elevate your leg when you are not up walking on it.   °o Continue to use the breathing machine you got in the hospital (incentive spirometer) which will help keep your temperature down.  It is common for your temperature to cycle up and down following surgery, especially at night when you are not up moving around and exerting yourself.  The breathing machine keeps your lungs expanded and your temperature down. ° ° °DIET:  As you were doing prior to hospitalization, we recommend a well-balanced diet. ° °DRESSING / WOUND CARE / SHOWERING ° °Keep the surgical dressing until follow up.  The dressing is water proof, so you can shower without any extra covering.  IF THE DRESSING FALLS OFF or the wound gets wet inside, change the dressing with sterile gauze.  Please use good hand washing techniques before changing the dressing.  Do not use any lotions or creams on the incision until instructed by your surgeon.   ° °ACTIVITY ° °o Increase activity slowly as tolerated, but follow the weight bearing instructions below.   °o No driving for 6 weeks or until further direction given by your physician.  You cannot drive while taking narcotics.  °o No lifting or carrying greater than 10 lbs. until further directed by your surgeon. °o Avoid periods of inactivity such as sitting longer than an hour when not asleep. This helps prevent blood clots.  °o You may return to work once you are authorized by your doctor.  ° ° ° °WEIGHT BEARING  ° °Weight bearing as tolerated with assist  device (walker, cane, etc) as directed, use it as long as suggested by your surgeon or therapist, typically at least 4-6 weeks. ° ° °EXERCISES ° °Results after joint replacement surgery are often greatly improved when you follow the exercise, range of motion and muscle strengthening exercises prescribed by your doctor. Safety measures are also important to protect the joint from further injury. Any time any of these exercises cause you to have increased pain or swelling, decrease what you are doing until you are comfortable again and then slowly increase them. If you have problems or questions, call your caregiver or physical therapist for advice.  ° °Rehabilitation is important following a joint replacement. After just a few days of immobilization, the muscles of the leg can become weakened and shrink (atrophy).  These exercises are designed to build up the tone and strength of the thigh and leg muscles and to improve motion. Often times heat used for twenty to thirty minutes before working out will loosen up your tissues and help with improving the range of motion but do not use heat for the first two weeks following surgery (sometimes heat can increase post-operative swelling).  ° °These exercises can be done on a training (exercise) mat, on the floor, on a table or on a bed. Use whatever works the best and is most comfortable for you.    Use music or television while you are exercising so that   the exercises are a pleasant break in your day. This will make your life better with the exercises acting as a break in your routine that you can look forward to.   Perform all exercises about fifteen times, three times per day or as directed.  You should exercise both the operative leg and the other leg as well. ° °Exercises include: °  °• Quad Sets - Tighten up the muscle on the front of the thigh (Quad) and hold for 5-10 seconds.   °• Straight Leg Raises - With your knee straight (if you were given a brace, keep it on),  lift the leg to 60 degrees, hold for 3 seconds, and slowly lower the leg.  Perform this exercise against resistance later as your leg gets stronger.  °• Leg Slides: Lying on your back, slowly slide your foot toward your buttocks, bending your knee up off the floor (only go as far as is comfortable). Then slowly slide your foot back down until your leg is flat on the floor again.  °• Angel Wings: Lying on your back spread your legs to the side as far apart as you can without causing discomfort.  °• Hamstring Strength:  Lying on your back, push your heel against the floor with your leg straight by tightening up the muscles of your buttocks.  Repeat, but this time bend your knee to a comfortable angle, and push your heel against the floor.  You may put a pillow under the heel to make it more comfortable if necessary.  ° °A rehabilitation program following joint replacement surgery can speed recovery and prevent re-injury in the future due to weakened muscles. Contact your doctor or a physical therapist for more information on knee rehabilitation.  ° ° °CONSTIPATION ° °Constipation is defined medically as fewer than three stools per week and severe constipation as less than one stool per week.  Even if you have a regular bowel pattern at home, your normal regimen is likely to be disrupted due to multiple reasons following surgery.  Combination of anesthesia, postoperative narcotics, change in appetite and fluid intake all can affect your bowels.  ° °YOU MUST use at least one of the following options; they are listed in order of increasing strength to get the job done.  They are all available over the counter, and you may need to use some, POSSIBLY even all of these options:   ° °Drink plenty of fluids (prune juice may be helpful) and high fiber foods °Colace 100 mg by mouth twice a day  °Senokot for constipation as directed and as needed Dulcolax (bisacodyl), take with full glass of water  °Miralax (polyethylene glycol)  once or twice a day as needed. ° °If you have tried all these things and are unable to have a bowel movement in the first 3-4 days after surgery call either your surgeon or your primary doctor.   ° °If you experience loose stools or diarrhea, hold the medications until you stool forms back up.  If your symptoms do not get better within 1 week or if they get worse, check with your doctor.  If you experience "the worst abdominal pain ever" or develop nausea or vomiting, please contact the office immediately for further recommendations for treatment. ° ° °ITCHING:  If you experience itching with your medications, try taking only a single pain pill, or even half a pain pill at a time.  You can also use Benadryl over the counter for itching or also to   help with sleep.   TED HOSE STOCKINGS:  Use stockings on both legs until for at least 2 weeks or as directed by physician office. They may be removed at night for sleeping.  MEDICATIONS:  See your medication summary on the After Visit Summary that nursing will review with you.  You may have some home medications which will be placed on hold until you complete the course of blood thinner medication.  It is important for you to complete the blood thinner medication as prescribed.  PRECAUTIONS:  If you experience chest pain or shortness of breath - call 911 immediately for transfer to the hospital emergency department.   If you develop a fever greater that 101 F, purulent drainage from wound, increased redness or drainage from wound, foul odor from the wound/dressing, or calf pain - CONTACT YOUR SURGEON.                                                   FOLLOW-UP APPOINTMENTS:  If you do not already have a post-op appointment, please call the office for an appointment to be seen by your surgeon.  Guidelines for how soon to be seen are listed in your After Visit Summary, but are typically between 1-4 weeks after surgery.  OTHER INSTRUCTIONS:   Knee  Replacement:  Do not place pillow under knee, focus on keeping the knee straight while resting.   MAKE SURE YOU:   Understand these instructions.   Get help right away if you are not doing well or get worse.    Thank you for letting us be a part of your medical care team.  It is a privilege we respect greatly.  We hope these instructions will help you stay on track for a fast and full recovery!     Information on my medicine - XARELTO (Rivaroxaban)  This medication education was reviewed with me or my healthcare representative as part of my discharge preparation.  The pharmacist that spoke with me during my hospital stay was:  Hershal Coria, [facility]  Why was Xarelto prescribed for you? Xarelto was prescribed for you to reduce the risk of blood clots forming after orthopedic surgery. The medical term for these abnormal blood clots is venous thromboembolism (VTE).  What do you need to know about xarelto ? Take your Xarelto ONCE DAILY at the same time every day. You may take it either with or without food.  If you have difficulty swallowing the tablet whole, you may crush it and mix in applesauce just prior to taking your dose.  Take Xarelto exactly as prescribed by your doctor and DO NOT stop taking Xarelto without talking to the doctor who prescribed the medication.  Stopping without other VTE prevention medication to take the place of Xarelto may increase your risk of developing a clot.  After discharge, you should have regular check-up appointments with your healthcare provider that is prescribing your Xarelto.    What do you do if you miss a dose? If you miss a dose, take it as soon as you remember on the same day then continue your regularly scheduled once daily regimen the next day. Do not take two doses of Xarelto on the same day.   Important Safety Information A possible side effect of Xarelto is bleeding. You should call your healthcare provider right away if you  experience any of the following: ? Bleeding from an injury or your nose that does not stop. ? Unusual colored urine (red or dark brown) or unusual colored stools (red or black). ? Unusual bruising for unknown reasons. ? A serious fall or if you hit your head (even if there is no bleeding).  Some medicines may interact with Xarelto and might increase your risk of bleeding while on Xarelto. To help avoid this, consult your healthcare provider or pharmacist prior to using any new prescription or non-prescription medications, including herbals, vitamins, non-steroidal anti-inflammatory drugs (NSAIDs) and supplements.  This website has more information on Xarelto: [email or web address]

## 2015-10-11 ENCOUNTER — Encounter (HOSPITAL_COMMUNITY): Payer: Self-pay | Admitting: Orthopedic Surgery

## 2015-10-11 LAB — BASIC METABOLIC PANEL
Anion gap: 6 (ref 5–15)
BUN: 21 mg/dL — ABNORMAL HIGH (ref 6–20)
CO2: 25 mmol/L (ref 22–32)
Calcium: 8.1 mg/dL — ABNORMAL LOW (ref 8.9–10.3)
Chloride: 110 mmol/L (ref 101–111)
Creatinine, Ser: 0.97 mg/dL (ref 0.61–1.24)
GFR calc Af Amer: >60 mL/min (ref >60)
GFR calc non Af Amer: >60 mL/min (ref >60)
Glucose, Bld: 129 mg/dL — ABNORMAL HIGH (ref 65–99)
Potassium: 4.5 mmol/L (ref 3.5–5.1)
Sodium: 141 mmol/L (ref 135–145)

## 2015-10-11 LAB — CBC
HCT: 35.3 % — ABNORMAL LOW (ref 39.0–52.0)
Hemoglobin: 11.1 g/dL — ABNORMAL LOW (ref 13.0–17.0)
MCH: 28.7 pg (ref 26.0–34.0)
MCHC: 31.4 g/dL (ref 30.0–36.0)
MCV: 91.2 fL (ref 78.0–100.0)
Platelets: 183 10*3/uL (ref 150–400)
RBC: 3.87 MIL/uL — ABNORMAL LOW (ref 4.22–5.81)
RDW: 16.5 % — ABNORMAL HIGH (ref 11.5–15.5)
WBC: 11.6 10*3/uL — ABNORMAL HIGH (ref 4.0–10.5)

## 2015-10-11 NOTE — Progress Notes (Signed)
Physical Therapy Treatment Patient Details Name: Julian Davis MRN: [id number] DOB: [birth year] Today's Date: 10/11/2015    History of Present Illness 82 yo male s/p conversion R uni-knee to TKA 10/10/15.     PT Comments    Continuing to mobilize well.   Follow Up Recommendations  Home health PT     Equipment Recommendations  None recommended by PT    Recommendations for Other Services       Precautions / Restrictions Precautions Precautions: Knee Restrictions Weight Bearing Restrictions: No RLE Weight Bearing: Weight bearing as tolerated    Mobility  Bed Mobility Overal bed mobility: Needs Assistance Bed Mobility: Supine to Sit     Supine to sit: Modified independent (Device/Increase time) Sit to supine: Modified independent (Device/Increase time)      Transfers Overall transfer level: Needs assistance Equipment used: Rolling walker (2 wheeled) Transfers: Sit to/from Stand Sit to Stand: Supervision         General transfer comment: VCs hand placement.   Ambulation/Gait Ambulation/Gait assistance: Supervision Ambulation Distance (Feet): 125 Feet Assistive device: Rolling walker (2 wheeled) Gait Pattern/deviations: Step-through pattern;Decreased stride length         Stairs            Wheelchair Mobility    Modified Rankin (Stroke Patients Only)       Balance                                    Cognition Arousal/Alertness: Awake/alert Behavior During Therapy: WFL for tasks assessed/performed Overall Cognitive Status: Within Functional Limits for tasks assessed                      Exercises      General Comments        Pertinent Vitals/Pain Pain Assessment: 0-10 Pain Score: 6  Pain Location: R knee Pain Descriptors / Indicators: Sore Pain Intervention(s): Monitored during session;Ice applied;Repositioned    Home Living Family/patient expects to be discharged to:: Private residence Living  Arrangements: Spouse/significant other Available Help at Discharge: Family;Available 24 hours/day Type of Home: House Home Access: Stairs to enter   Home Layout: One level;Laundry or work area in [place]: Environmental consultant - 2 wheels;Cane - single point;Toilet riser;Grab bars - tub/shower;Shower seat;Bedside commode;Crutches      Prior Function Level of Independence: Independent          PT Goals (current goals can now be found in the care plan section) Acute Rehab PT Goals Patient Stated Goal: to regain PLOF. less pain Progress towards PT goals: Progressing toward goals    Frequency  7X/week    PT Plan Current plan remains appropriate    Co-evaluation             End of Session Equipment Utilized During Treatment: Gait belt Activity Tolerance: Patient tolerated treatment well Patient left: in bed;with call bell/phone within reach     Time: 1345-1400 PT Time Calculation (min) (ACUTE ONLY): 15 min  Charges:  $Gait Training: 8-22 mins                    G Codes:      Weston Anna, MPT Pager: [phone]

## 2015-10-11 NOTE — Progress Notes (Signed)
Occupational Therapy Evaluation Patient Details Name: Julian Davis MRN: [id number] DOB: [birth year] Today's Date: 10/11/2015    History of Present Illness 82 yo male s/p conversion R uni-knee to TKA 10/10/15.    Clinical Impression   All education completed regarding ADL/ADL transfers. Patient will have wife's assistance at home. No further OT needs identified. Will sign off.    Follow Up Recommendations  No OT follow up;Supervision - Intermittent    Equipment Recommendations  None recommended by OT    Recommendations for Other Services       Precautions / Restrictions Precautions Precautions: Knee Restrictions Weight Bearing Restrictions: No RLE Weight Bearing: Weight bearing as tolerated      Mobility Bed Mobility Overal bed mobility: Needs Assistance Bed Mobility: Supine to Sit;Sit to Supine     Supine to sit: Supervision Sit to supine: Supervision      Transfers Overall transfer level: Needs assistance Equipment used: Rolling walker (2 wheeled) Transfers: Sit to/from Stand Sit to Stand: Supervision         General transfer comment: VCs hand placement.     Balance                                            ADL Overall ADL's : Needs assistance/impaired Eating/Feeding: Independent;Sitting   Grooming: Supervision/safety;Standing   Upper Body Bathing: Set up;Sitting   Lower Body Bathing: Minimal assistance;Sit to/from stand   Upper Body Dressing : Set up;Sitting   Lower Body Dressing: Minimal assistance;Sit to/from stand   Toilet Transfer: Supervision/safety;Ambulation;BSC;RW   Toileting- Clothing Manipulation and Hygiene: Supervision/safety;Sit to/from stand   Tub/ Shower Transfer: Walk-in shower;Supervision/safety;Ambulation;Rolling walker   Functional mobility during ADLs: Supervision/safety;Rolling walker General ADL Comments: Patient reports his wife will assist with LB bathing/dressing at home. He had difficulty  reaching to feet during this session. Performed toilet and shower transfers in bathroom. No further OT needs identified.     Vision     Perception     Praxis      Pertinent Vitals/Pain Pain Assessment: 0-10 Pain Score: 5  Pain Location: R knee Pain Descriptors / Indicators: Sore Pain Intervention(s): Limited activity within patient's tolerance;Monitored during session;Repositioned     Hand Dominance Right   Extremity/Trunk Assessment Upper Extremity Assessment Upper Extremity Assessment: Overall WFL for tasks assessed   Lower Extremity Assessment Lower Extremity Assessment: Defer to PT evaluation   Cervical / Trunk Assessment Cervical / Trunk Assessment: Normal   Communication Communication Communication: No difficulties   Cognition Arousal/Alertness: Awake/alert Behavior During Therapy: WFL for tasks assessed/performed Overall Cognitive Status: Within Functional Limits for tasks assessed                     General Comments       Exercises       Shoulder Instructions      Home Living Family/patient expects to be discharged to:: Private residence Living Arrangements: Spouse/significant other Available Help at Discharge: Family;Available 24 hours/day Type of Home: House Home Access: Stairs to enter [employer] of Steps: 1   Home Layout: One level;Laundry or work area in basement     [employer]: Occupational psychologist: Handicapped height Bathroom Accessibility: Yes How Accessible: Accessible via [place]: Environmental consultant - 2 wheels;Cane - single point;Toilet riser;Grab bars - tub/shower;Shower seat;Bedside commode;Crutches  Prior Functioning/Environment Level of Independence: Independent             OT Diagnosis: Acute pain   OT Problem List: Decreased range of motion;Decreased knowledge of use of DME or AE;Pain   OT Treatment/Interventions:      OT Goals(Current goals can be found in the  care plan section) Acute Rehab OT Goals Patient Stated Goal: to regain PLOF. less pain OT Goal Formulation: All assessment and education complete, DC therapy  OT Frequency:     Barriers to D/C:            Co-evaluation              End of Session Equipment Utilized During Treatment: Rolling walker Nurse Communication: Mobility status  Activity Tolerance: Patient tolerated treatment well Patient left: in bed;with call bell/phone within reach;with bed alarm set   Time: [id number] OT Time Calculation (min): 17 min Charges:  OT General Charges $OT Visit: 1 Procedure OT Evaluation $OT Eval Low Complexity: 1 Procedure G-Codes:    [name] 10-21-15, 1:24 PM

## 2015-10-11 NOTE — Care Management Note (Signed)
Case Management Note  Patient Details  Name: Julian Davis MRN: [id number] Date of Birth: [birth year]  Subjective/Objective:                  CONVERSION OF RIGHT UNI KNEE TO RIGHT TOTAL KNEE ARTHROPLASTY (Right) Action/Plan: Discharge planning Expected Discharge Date:  10/12/15               Expected Discharge Plan:  [place]  In-House Referral:     Discharge planning Services  CM Consult  Post Acute Care Choice:    Choice offered to:  Patient  DME Arranged:  N/A DME Agency:  NA  HH Arranged:  PT HH Agency:  [facility]  Status of Service:  Completed, signed off  Medicare Important Message Given:    Date Medicare IM Given:    Medicare IM give by:    Date Additional Medicare IM Given:    Additional Medicare Important Message give by:     If discussed at [place] of Stay Meetings, dates discussed:    Additional Comments: CM met with pt in room to offer choice of home health agency.  Pt chooses [facility] to render HHPT.  Referral called to [employer], Tim.  Pt has both rolling walker and no need of 3n1.  No other CM needs were communicated. Dellie Catholic, RN 10/11/2015, 1:34 PM

## 2015-10-11 NOTE — Progress Notes (Signed)
Patient ID: Julian Davis, male   DOB: 1926/11/04, 82 y.o.   MRN: [id number] Subjective: 1 Day Post-Op Procedure(s) (LRB): CONVERSION OF RIGHT UNI KNEE TO RIGHT TOTAL KNEE ARTHROPLASTY (Right)    Patient reports pain as mild.  Doing well.  Worried about independent functioning as his wife as been ill  Objective:   VITALS:   Filed Vitals:   10/11/15 1023 10/11/15 1436  BP: 115/54 136/64  Pulse: 76 76  Temp: 98.4 F (36.9 C) 98.3 F (36.8 C)  Resp: 16 16    Neurovascular intact Incision: dressing C/D/I  LABS  Recent Labs  10/11/15 0352  HGB 11.1*  HCT 35.3*  WBC 11.6*  PLT 183     Recent Labs  10/11/15 0352  NA 141  K 4.5  BUN 21*  CREATININE 0.97  GLUCOSE 129*    No results for input(s): LABPT, INR in the last 72 hours.   Assessment/Plan: 1 Day Post-Op Procedure(s) (LRB): CONVERSION OF RIGHT UNI KNEE TO RIGHT TOTAL KNEE ARTHROPLASTY (Right)   Advance diet Up with therapy Plan for discharge tomorrow Discharge home with home health   Will work on general strengthening over next day to improve chances to go home versus SNF

## 2015-10-11 NOTE — Progress Notes (Signed)
Physical Therapy Treatment Patient Details Name: Julian Davis MRN: [id number] DOB: 1926/09/08 Today's Date: 10/11/2015    History of Present Illness 82 yo male s/p conversion R uni-knee to TKA 10/10/15.     PT Comments    Continuing to perform well. Pain rated 7/10 with activity.   Follow Up Recommendations  Home health PT     Equipment Recommendations  None recommended by PT    Recommendations for Other Services       Precautions / Restrictions Precautions Precautions: Knee Restrictions Weight Bearing Restrictions: No RLE Weight Bearing: Weight bearing as tolerated    Mobility  Bed Mobility Overal bed mobility: Needs Assistance Bed Mobility: Supine to Sit;Sit to Supine     Supine to sit: Modified independent (Device/Increase time) Sit to supine: Modified independent (Device/Increase time)      Transfers Overall transfer level: Needs assistance Equipment used: Rolling walker (2 wheeled) Transfers: Sit to/from Stand Sit to Stand: Supervision         General transfer comment: VCs hand placement.   Ambulation/Gait Ambulation/Gait assistance: Supervision Ambulation Distance (Feet): 135 Feet Assistive device: Rolling walker (2 wheeled) Gait Pattern/deviations: Step-through pattern;Decreased stride length     General Gait Details: for safety.    Stairs            Wheelchair Mobility    Modified Rankin (Stroke Patients Only)       Balance                                    Cognition Arousal/Alertness: Awake/alert Behavior During Therapy: WFL for tasks assessed/performed Overall Cognitive Status: Within Functional Limits for tasks assessed                      Exercises Total Joint Exercises Ankle Circles/Pumps: AROM;Both;10 reps;Supine Quad Sets: AROM;Both;10 reps;Supine Hip ABduction/ADduction: AROM;Right;10 reps;Supine Straight Leg Raises: AROM;Right;10 reps;Supine Knee Flexion: Right;10 reps;Seated;AROM  (with towel under foot) Goniometric ROM: ~5-90    General Comments        Pertinent Vitals/Pain Pain Assessment: 0-10 Pain Score: 7  Pain Location: R knee with activity Pain Descriptors / Indicators: Sore;Aching Pain Intervention(s): Monitored during session;Ice applied;Repositioned    Home Living Family/patient expects to be discharged to:: Private residence Living Arrangements: Spouse/significant other Available Help at Discharge: Family;Available 24 hours/day Type of Home: House Home Access: Stairs to enter   Home Layout: One level;Laundry or work area in [place]: Environmental consultant - 2 wheels;Cane - single point;Toilet riser;Grab bars - tub/shower;Shower seat;Bedside commode;Crutches      Prior Function Level of Independence: Independent          PT Goals (current goals can now be found in the care plan section) Progress towards PT goals: Progressing toward goals    Frequency  7X/week    PT Plan Current plan remains appropriate    Co-evaluation             End of Session   Activity Tolerance: Patient tolerated treatment well Patient left: in bed;with call bell/phone within reach;with bed alarm set     Time: [id number] PT Time Calculation (min) (ACUTE ONLY): 12 min  Charges:  $Gait Training: 8-22 mins                    G Codes:      Weston Anna, MPT Pager: [phone]

## 2015-10-12 LAB — CBC
HCT: 31.9 % — ABNORMAL LOW (ref 39.0–52.0)
Hemoglobin: 10.2 g/dL — ABNORMAL LOW (ref 13.0–17.0)
MCH: 29 pg (ref 26.0–34.0)
MCHC: 32 g/dL (ref 30.0–36.0)
MCV: 90.6 fL (ref 78.0–100.0)
PLATELETS: 164 10*3/uL (ref 150–400)
RBC: 3.52 MIL/uL — AB (ref 4.22–5.81)
RDW: 16.6 % — AB (ref 11.5–15.5)
WBC: 8.2 10*3/uL (ref 4.0–10.5)

## 2015-10-12 LAB — BASIC METABOLIC PANEL
ANION GAP: 6 (ref 5–15)
BUN: 16 mg/dL (ref 6–20)
CALCIUM: 7.9 mg/dL — AB (ref 8.9–10.3)
CO2: 25 mmol/L (ref 22–32)
Chloride: 107 mmol/L (ref 101–111)
Creatinine, Ser: 0.69 mg/dL (ref 0.61–1.24)
GFR calc Af Amer: >60 mL/min (ref >60)
Glucose, Bld: 105 mg/dL — ABNORMAL HIGH (ref 65–99)
POTASSIUM: 3.9 mmol/L (ref 3.5–5.1)
SODIUM: 138 mmol/L (ref 135–145)

## 2015-10-12 MED ORDER — TIZANIDINE HCL 4 MG PO TABS: 4.0000 mg | ORAL_TABLET | Freq: Four times a day (QID) | ORAL | Status: DC | PRN

## 2015-10-12 MED ORDER — RIVAROXABAN 10 MG PO TABS: 10.0000 mg | ORAL_TABLET | ORAL | Status: DC

## 2015-10-12 MED ORDER — HYDROCODONE-ACETAMINOPHEN 7.5-325 MG PO TABS: 1.0000 | ORAL_TABLET | ORAL | Status: DC | PRN

## 2015-10-12 MED ORDER — DOCUSATE SODIUM 100 MG PO CAPS: 100.0000 mg | ORAL_CAPSULE | Freq: Two times a day (BID) | ORAL | Status: DC

## 2015-10-12 MED ORDER — POLYETHYLENE GLYCOL 3350 17 G PO PACK: 17.0000 g | PACK | Freq: Two times a day (BID) | ORAL | Status: DC

## 2015-10-12 MED ORDER — FERROUS SULFATE 325 (65 FE) MG PO TABS: 325.0000 mg | ORAL_TABLET | Freq: Three times a day (TID) | ORAL | Status: DC

## 2015-10-12 NOTE — Progress Notes (Addendum)
     Subjective: 2 Days Post-Op Procedure(s) (LRB): CONVERSION OF RIGHT UNI KNEE TO RIGHT TOTAL KNEE ARTHROPLASTY (Right)   Patient reports pain as mild, pain controlled. No events throughout the night. Discussed the procedure at length.   States that his wife is better today.  Ready to be discharged home.  Objective:   VITALS:   Filed Vitals:   10/11/15 2134 10/12/15 0531  BP: 130/66 122/59  Pulse: 68 71  Temp: 98.6 F (37 C) 98.3 F (36.8 C)  Resp: 16 16    Dorsiflexion/Plantar flexion intact Incision: dressing C/D/I No cellulitis present Compartment soft  LABS  Recent Labs  10/11/15 0352 10/12/15 0342  HGB 11.1* 10.2*  HCT 35.3* 31.9*  WBC 11.6* 8.2  PLT 183 164     Recent Labs  10/11/15 0352 10/12/15 0342  NA 141 138  K 4.5 3.9  BUN 21* 16  CREATININE 0.97 0.69  GLUCOSE 129* 105*     Assessment/Plan: 2 Days Post-Op Procedure(s) (LRB): CONVERSION OF RIGHT UNI KNEE TO RIGHT TOTAL KNEE ARTHROPLASTY (Right) Up with therapy Discharge home with home health  Follow up in 2 weeks at [facility]. Follow up with OLIN,Jorene Kaylor D in 2 weeks.  Contact information:  [facility] [address] [place]   PAC  10/12/2015, 9:12 AM

## 2015-10-12 NOTE — Progress Notes (Signed)
Patient verbalized understanding discharge instructions. Patient is stable at discharge.

## 2015-10-12 NOTE — Progress Notes (Signed)
Physical Therapy Treatment Patient Details Name: Julian Davis MRN: [id number] DOB: 01-27-1927 Today's Date: 10/12/2015    History of Present Illness 82 yo male s/p conversion R uni-knee to TKA 10/10/15.     PT Comments    Pt progressing well with mobility and eager for return home.  Reviewed therex and stairs.  Follow Up Recommendations  Home health PT     Equipment Recommendations  None recommended by PT    Recommendations for Other Services       Precautions / Restrictions Precautions Precautions: Knee Restrictions Weight Bearing Restrictions: No RLE Weight Bearing: Weight bearing as tolerated    Mobility  Bed Mobility Overal bed mobility: Modified Independent Bed Mobility: Supine to Sit     Supine to sit: Modified independent (Device/Increase time)        Transfers Overall transfer level: Needs assistance Equipment used: Rolling walker (2 wheeled) Transfers: Sit to/from Stand Sit to Stand: Supervision         General transfer comment: VCs hand placement.   Ambulation/Gait Ambulation/Gait assistance: Min guard;Supervision Ambulation Distance (Feet): 150 Feet Assistive device: Rolling walker (2 wheeled) Gait Pattern/deviations: Step-to pattern;Step-through pattern;Decreased step length - right;Decreased step length - left;Shuffle;Trunk flexed     General Gait Details: min cues for posture and position from RW   Stairs Stairs: Yes Stairs assistance: Min assist Stair Management: No rails;Step to pattern;Forwards;With walker Number of Stairs: 2 General stair comments: cues for sequence and foot/RW placement  Wheelchair Mobility    Modified Rankin (Stroke Patients Only)       Balance                                    Cognition Arousal/Alertness: Awake/alert Behavior During Therapy: WFL for tasks assessed/performed Overall Cognitive Status: Within Functional Limits for tasks assessed                       Exercises Total Joint Exercises Ankle Circles/Pumps: AROM;Both;Supine;20 reps Quad Sets: AROM;Both;Supine;20 reps Heel Slides: AAROM;Right;20 reps;Supine Straight Leg Raises: AROM;Right;Supine;20 reps    General Comments        Pertinent Vitals/Pain Pain Assessment: 0-10 Pain Score: 6  Pain Location: R Knee Pain Descriptors / Indicators: Aching;Sore Pain Intervention(s): Limited activity within patient's tolerance;Monitored during session;Premedicated before session;Ice applied    Home Living                      Prior Function            PT Goals (current goals can now be found in the care plan section) Acute Rehab PT Goals Patient Stated Goal: to regain PLOF. less pain PT Goal Formulation: With patient Time For Goal Achievement: 10/17/15 Potential to Achieve Goals: Good Progress towards PT goals: Progressing toward goals    Frequency  7X/week    PT Plan Current plan remains appropriate    Co-evaluation             End of Session Equipment Utilized During Treatment: Gait belt Activity Tolerance: Patient tolerated treatment well Patient left: in chair;with call bell/phone within reach     Time: 0825-0858 PT Time Calculation (min) (ACUTE ONLY): 33 min  Charges:  $Gait Training: 8-22 mins $Therapeutic Exercise: 8-22 mins                    G Codes:  Chaunda Vandergriff 10/12/2015, 11:35 AM

## 2015-10-13 DIAGNOSIS — I10 Essential (primary) hypertension: Secondary | ICD-10-CM | POA: Diagnosis not present

## 2015-10-13 DIAGNOSIS — Z96651 Presence of right artificial knee joint: Secondary | ICD-10-CM | POA: Diagnosis not present

## 2015-10-13 DIAGNOSIS — Z471 Aftercare following joint replacement surgery: Secondary | ICD-10-CM | POA: Diagnosis not present

## 2015-10-13 DIAGNOSIS — Z9181 History of falling: Secondary | ICD-10-CM | POA: Diagnosis not present

## 2015-10-13 DIAGNOSIS — Z7901 Long term (current) use of anticoagulants: Secondary | ICD-10-CM | POA: Diagnosis not present

## 2015-10-14 DIAGNOSIS — Z96651 Presence of right artificial knee joint: Secondary | ICD-10-CM | POA: Diagnosis not present

## 2015-10-14 DIAGNOSIS — Z471 Aftercare following joint replacement surgery: Secondary | ICD-10-CM | POA: Diagnosis not present

## 2015-10-14 DIAGNOSIS — Z7901 Long term (current) use of anticoagulants: Secondary | ICD-10-CM | POA: Diagnosis not present

## 2015-10-14 DIAGNOSIS — Z9181 History of falling: Secondary | ICD-10-CM | POA: Diagnosis not present

## 2015-10-14 DIAGNOSIS — I10 Essential (primary) hypertension: Secondary | ICD-10-CM | POA: Diagnosis not present

## 2015-10-16 NOTE — Anesthesia Postprocedure Evaluation (Signed)
Anesthesia Post Note  Patient: CHRISTIFER ETHERTON  Procedure(s) Performed: Procedure(s) (LRB): CONVERSION OF RIGHT UNI KNEE TO RIGHT TOTAL KNEE ARTHROPLASTY (Right)  Patient location during evaluation: PACU Anesthesia Type: Spinal Level of consciousness: awake Pain management: pain level controlled Vital Signs Assessment: post-procedure vital signs reviewed and stable Respiratory status: spontaneous breathing and respiratory function stable Cardiovascular status: stable Postop Assessment: no signs of nausea or vomiting and spinal receding Anesthetic complications: no    Last Vitals:  Filed Vitals:   10/11/15 2134 10/12/15 0531  BP: 130/66 122/59  Pulse: 68 71  Temp: 37 C 36.8 C  Resp: 16 16    Last Pain:  Filed Vitals:   10/12/15 1250  PainSc: 2                  Cassiopeia Florentino

## 2015-10-17 DIAGNOSIS — Z9181 History of falling: Secondary | ICD-10-CM | POA: Diagnosis not present

## 2015-10-17 DIAGNOSIS — Z96651 Presence of right artificial knee joint: Secondary | ICD-10-CM | POA: Diagnosis not present

## 2015-10-17 DIAGNOSIS — Z471 Aftercare following joint replacement surgery: Secondary | ICD-10-CM | POA: Diagnosis not present

## 2015-10-17 DIAGNOSIS — I10 Essential (primary) hypertension: Secondary | ICD-10-CM | POA: Diagnosis not present

## 2015-10-17 DIAGNOSIS — Z7901 Long term (current) use of anticoagulants: Secondary | ICD-10-CM | POA: Diagnosis not present

## 2015-10-17 NOTE — Discharge Summary (Signed)
Physician Discharge Summary  Patient ID: OTHNIEL URCIUOLI MRN: [id number] DOB/AGE: Oct 03, 1926 82 y.o.  Admit date: 10/10/2015 Discharge date: 10/12/2015   Procedures:  Procedure(s) (LRB): CONVERSION OF RIGHT UNI KNEE TO RIGHT TOTAL KNEE ARTHROPLASTY (Right)  Attending Physician:  Dr. 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 Status post right partial knee replacement   . Hypertension   . Heart murmur     hx of  . Arthritis     HPI:    Julian Davis, 82 y.o. male, has a history of pain and functional disability in the right knee(s) due to arthritis and patient has failed non-surgical conservative treatments for greater than 12 weeks to include NSAID's and/or analgesics and activity modification. The indications for the revision of the total knee arthroplasty are progressive OA in the PF and lateral compartments. Onset of symptoms was gradual starting years ago with gradually worsening course since that time. Prior procedures on the right knee(s) include unicompartmental arthroplasty. Patient currently rates pain in the right knee(s) at 8 out of 10 with activity. There is night pain, worsening of pain with activity and weight bearing, pain that interferes with activities of daily living, pain with passive range of motion, crepitus and joint swelling. Patient has evidence of progressive OA in the PF and lateral compartments by imaging studies. This condition presents safety issues increasing the risk of falls. There is no current active infection. Risks, benefits and expectations were discussed with the patient. Risks including but not limited to the risk of anesthesia, blood clots, nerve damage, blood vessel damage, failure of the prosthesis, infection and up to and including death. Patient understand the risks, benefits and  expectations and wishes to proceed with surgery.   PCP: Myrlene Broker, MD   Discharged Condition: good  Hospital Course:  Patient underwent the above stated procedure on 10/10/2015. Patient tolerated the procedure well and brought to the recovery room in good condition and subsequently to the floor.  POD #1 BP: 136/64 ; Pulse: 76 ; Temp: 98.3 F (36.8 C) ; Resp: 16 Patient reports pain as mild. Doing well. Worried about independent functioning as his wife as been ill. Neurovascular intact and incision: dressing C/D/I  LABS  Basename    HGB     11.1  HCT     35.3   POD #2  BP: 122/59 ; Pulse: 71 ; Temp: 98.3 F (36.8 C) ; Resp: 16 Patient reports pain as mild, pain controlled. No events throughout the night. Discussed the procedure at length. States that his wife is better today. Ready to be discharged home. Dorsiflexion/plantar flexion intact, incision: dressing C/D/I, no cellulitis present and compartment soft.   LABS  Basename    HGB     10.2  HCT     31.9    Discharge Exam: General appearance: alert, cooperative and no distress Extremities: Homans sign is negative, no sign of DVT, no edema, redness or tenderness in the calves or thighs and no ulcers, gangrene or trophic changes  Disposition: Home with follow up in 2 weeks   Follow-up Information    Follow up with Mauri Pole, MD. Schedule an appointment as soon as possible for a visit in 2 weeks.   Specialty:  Orthopedic Surgery   Contact information:   [address] [place]  [place] [address] [phone]       Follow up with [facility].   Why:  home health physical therapy   Contact information:   [address] [address] [phone]       Follow up with [facility].   Contact information:   [address] [place] [address] [phone]       Discharge Instructions    Call MD / Call 911    Complete by:  As directed   If  you experience chest pain or shortness of breath, CALL 911 and be transported to the hospital emergency room.  If you develope a fever above 101 F, pus (white drainage) or increased drainage or redness at the wound, or calf pain, call your surgeon's office.     Change dressing    Complete by:  As directed   Maintain surgical dressing until follow up in the clinic. If the edges start to pull up, may reinforce with tape. If the dressing is no longer working, may remove and cover with gauze and tape, but must keep the area dry and clean.  Call with any questions or concerns.     Constipation Prevention    Complete by:  As directed   Drink plenty of fluids.  Prune juice may be helpful.  You may use a stool softener, such as Colace (over the counter) 100 mg twice a day.  Use MiraLax (over the counter) for constipation as needed.     Diet - low sodium heart healthy    Complete by:  As directed      Discharge instructions    Complete by:  As directed   Maintain surgical dressing until follow up in the clinic. If the edges start to pull up, may reinforce with tape. If the dressing is no longer working, may remove and cover with gauze and tape, but must keep the area dry and clean.  Follow up in 2 weeks at [facility]. Call with any questions or concerns.     Increase activity slowly as tolerated    Complete by:  As directed   Weight bearing as tolerated with assist device (walker, cane, etc) as directed, use it as long as suggested by your surgeon or therapist, typically at least 4-6 weeks.     TED hose    Complete by:  As directed   Use stockings (TED hose) for 2 weeks on both leg(s).  You may remove them at night for sleeping.             Medication List    STOP taking these medications        acetaminophen 500 MG tablet  Commonly known as:  TYLENOL      TAKE these medications        amLODipine-benazepril 5-20 MG capsule  Commonly known as:  LOTREL  Take 1 capsule by mouth  every morning.     amoxicillin 500 MG capsule  Commonly known as:  AMOXIL  TAKE 4 CAPSULES 1 HOUR PRIOR TO VISIT     BESIVANCE 0.6 % Susp  Generic drug:  Besifloxacin HCl  INSTILL ONE DROP INTO RIGHT EYE 4 TIMES A DAY FOR 2 DAYS AFTER EACH MONTHLY EYE INJECTION     docusate sodium 100 MG capsule  Commonly known as:  COLACE  Take 1 capsule (100 mg total) by mouth 2 (two) times daily.     ferrous sulfate 325 (65 FE) MG tablet  Take 1 tablet (325 mg total) by mouth 3 (three) times daily after meals.     HYDROcodone-acetaminophen 7.5-325 MG tablet  Commonly known as:  NORCO  Take 1-2 tablets by mouth every 4 (four) hours as needed for moderate pain.     multivitamin with minerals Tabs tablet  Take 1 tablet by mouth 3 (three) times daily.     polyethylene glycol packet  Commonly known as:  MIRALAX / GLYCOLAX  Take 17 g by mouth 2 (two) times daily.     PRESCRIPTION MEDICATION  Place 1 each into the right eye every 28 (twenty-eight) days. Injection     rivaroxaban 10 MG Tabs tablet  Commonly known as:  XARELTO  Take 1 tablet (10 mg total) by mouth daily.     tamsulosin 0.4 MG Caps capsule  Commonly known as:  FLOMAX  Take 0.4 mg by mouth at bedtime.     tiZANidine 4 MG tablet  Commonly known as:  ZANAFLEX  Take 1 tablet (4 mg total) by mouth every 6 (six) hours as needed for muscle spasms.     Vitamin D (Ergocalciferol) 50000 units Caps capsule  Commonly known as:  DRISDOL  Take 1 capsule by mouth 2 (two) times a week. Wednesday and Saturday.         Signed: West Pugh. Perry Brucato   PA-C  10/17/2015, 10:29 AM

## 2015-10-19 DIAGNOSIS — Z9181 History of falling: Secondary | ICD-10-CM | POA: Diagnosis not present

## 2015-10-19 DIAGNOSIS — Z96651 Presence of right artificial knee joint: Secondary | ICD-10-CM | POA: Diagnosis not present

## 2015-10-19 DIAGNOSIS — I10 Essential (primary) hypertension: Secondary | ICD-10-CM | POA: Diagnosis not present

## 2015-10-19 DIAGNOSIS — Z7901 Long term (current) use of anticoagulants: Secondary | ICD-10-CM | POA: Diagnosis not present

## 2015-10-19 DIAGNOSIS — Z471 Aftercare following joint replacement surgery: Secondary | ICD-10-CM | POA: Diagnosis not present

## 2015-10-20 DIAGNOSIS — Z9181 History of falling: Secondary | ICD-10-CM | POA: Diagnosis not present

## 2015-10-20 DIAGNOSIS — Z7901 Long term (current) use of anticoagulants: Secondary | ICD-10-CM | POA: Diagnosis not present

## 2015-10-20 DIAGNOSIS — I10 Essential (primary) hypertension: Secondary | ICD-10-CM | POA: Diagnosis not present

## 2015-10-20 DIAGNOSIS — Z471 Aftercare following joint replacement surgery: Secondary | ICD-10-CM | POA: Diagnosis not present

## 2015-10-20 DIAGNOSIS — Z96651 Presence of right artificial knee joint: Secondary | ICD-10-CM | POA: Diagnosis not present

## 2015-10-21 DIAGNOSIS — M25661 Stiffness of right knee, not elsewhere classified: Secondary | ICD-10-CM | POA: Diagnosis not present

## 2015-10-21 DIAGNOSIS — M25561 Pain in right knee: Secondary | ICD-10-CM | POA: Diagnosis not present

## 2015-10-21 DIAGNOSIS — M1711 Unilateral primary osteoarthritis, right knee: Secondary | ICD-10-CM | POA: Diagnosis not present

## 2015-10-21 DIAGNOSIS — M6281 Muscle weakness (generalized): Secondary | ICD-10-CM | POA: Diagnosis not present

## 2015-10-24 DIAGNOSIS — Z96651 Presence of right artificial knee joint: Secondary | ICD-10-CM | POA: Diagnosis not present

## 2015-10-24 DIAGNOSIS — Z471 Aftercare following joint replacement surgery: Secondary | ICD-10-CM | POA: Diagnosis not present

## 2015-10-25 ENCOUNTER — Encounter (INDEPENDENT_AMBULATORY_CARE_PROVIDER_SITE_OTHER): Payer: Medicare Other | Admitting: Ophthalmology

## 2015-10-25 DIAGNOSIS — M25661 Stiffness of right knee, not elsewhere classified: Secondary | ICD-10-CM | POA: Diagnosis not present

## 2015-10-25 DIAGNOSIS — M1711 Unilateral primary osteoarthritis, right knee: Secondary | ICD-10-CM | POA: Diagnosis not present

## 2015-10-25 DIAGNOSIS — M6281 Muscle weakness (generalized): Secondary | ICD-10-CM | POA: Diagnosis not present

## 2015-10-25 DIAGNOSIS — M25561 Pain in right knee: Secondary | ICD-10-CM | POA: Diagnosis not present

## 2015-10-28 DIAGNOSIS — M25561 Pain in right knee: Secondary | ICD-10-CM | POA: Diagnosis not present

## 2015-10-28 DIAGNOSIS — M6281 Muscle weakness (generalized): Secondary | ICD-10-CM | POA: Diagnosis not present

## 2015-10-28 DIAGNOSIS — M1711 Unilateral primary osteoarthritis, right knee: Secondary | ICD-10-CM | POA: Diagnosis not present

## 2015-10-28 DIAGNOSIS — M25661 Stiffness of right knee, not elsewhere classified: Secondary | ICD-10-CM | POA: Diagnosis not present

## 2015-11-01 DIAGNOSIS — M25561 Pain in right knee: Secondary | ICD-10-CM | POA: Diagnosis not present

## 2015-11-01 DIAGNOSIS — M1711 Unilateral primary osteoarthritis, right knee: Secondary | ICD-10-CM | POA: Diagnosis not present

## 2015-11-01 DIAGNOSIS — M6281 Muscle weakness (generalized): Secondary | ICD-10-CM | POA: Diagnosis not present

## 2015-11-01 DIAGNOSIS — M25661 Stiffness of right knee, not elsewhere classified: Secondary | ICD-10-CM | POA: Diagnosis not present

## 2015-11-02 ENCOUNTER — Encounter (INDEPENDENT_AMBULATORY_CARE_PROVIDER_SITE_OTHER): Payer: Medicare Other | Admitting: Ophthalmology

## 2015-11-03 DIAGNOSIS — M1711 Unilateral primary osteoarthritis, right knee: Secondary | ICD-10-CM | POA: Diagnosis not present

## 2015-11-03 DIAGNOSIS — M6281 Muscle weakness (generalized): Secondary | ICD-10-CM | POA: Diagnosis not present

## 2015-11-03 DIAGNOSIS — M25661 Stiffness of right knee, not elsewhere classified: Secondary | ICD-10-CM | POA: Diagnosis not present

## 2015-11-03 DIAGNOSIS — M25561 Pain in right knee: Secondary | ICD-10-CM | POA: Diagnosis not present

## 2015-11-08 DIAGNOSIS — M25661 Stiffness of right knee, not elsewhere classified: Secondary | ICD-10-CM | POA: Diagnosis not present

## 2015-11-08 DIAGNOSIS — M6281 Muscle weakness (generalized): Secondary | ICD-10-CM | POA: Diagnosis not present

## 2015-11-08 DIAGNOSIS — M25561 Pain in right knee: Secondary | ICD-10-CM | POA: Diagnosis not present

## 2015-11-08 DIAGNOSIS — M1711 Unilateral primary osteoarthritis, right knee: Secondary | ICD-10-CM | POA: Diagnosis not present

## 2015-11-10 DIAGNOSIS — M25561 Pain in right knee: Secondary | ICD-10-CM | POA: Diagnosis not present

## 2015-11-10 DIAGNOSIS — M25661 Stiffness of right knee, not elsewhere classified: Secondary | ICD-10-CM | POA: Diagnosis not present

## 2015-11-10 DIAGNOSIS — M1711 Unilateral primary osteoarthritis, right knee: Secondary | ICD-10-CM | POA: Diagnosis not present

## 2015-11-10 DIAGNOSIS — M6281 Muscle weakness (generalized): Secondary | ICD-10-CM | POA: Diagnosis not present

## 2015-11-11 ENCOUNTER — Encounter (INDEPENDENT_AMBULATORY_CARE_PROVIDER_SITE_OTHER): Payer: Medicare Other | Admitting: Ophthalmology

## 2015-11-11 DIAGNOSIS — I10 Essential (primary) hypertension: Secondary | ICD-10-CM | POA: Diagnosis not present

## 2015-11-11 DIAGNOSIS — H353211 Exudative age-related macular degeneration, right eye, with active choroidal neovascularization: Secondary | ICD-10-CM

## 2015-11-11 DIAGNOSIS — H353124 Nonexudative age-related macular degeneration, left eye, advanced atrophic with subfoveal involvement: Secondary | ICD-10-CM

## 2015-11-11 DIAGNOSIS — H43813 Vitreous degeneration, bilateral: Secondary | ICD-10-CM | POA: Diagnosis not present

## 2015-11-11 DIAGNOSIS — H35033 Hypertensive retinopathy, bilateral: Secondary | ICD-10-CM | POA: Diagnosis not present

## 2015-11-18 DIAGNOSIS — M25561 Pain in right knee: Secondary | ICD-10-CM | POA: Diagnosis not present

## 2015-11-18 DIAGNOSIS — M6281 Muscle weakness (generalized): Secondary | ICD-10-CM | POA: Diagnosis not present

## 2015-11-18 DIAGNOSIS — M1711 Unilateral primary osteoarthritis, right knee: Secondary | ICD-10-CM | POA: Diagnosis not present

## 2015-11-18 DIAGNOSIS — M25661 Stiffness of right knee, not elsewhere classified: Secondary | ICD-10-CM | POA: Diagnosis not present

## 2015-11-23 DIAGNOSIS — M6281 Muscle weakness (generalized): Secondary | ICD-10-CM | POA: Diagnosis not present

## 2015-11-23 DIAGNOSIS — Z471 Aftercare following joint replacement surgery: Secondary | ICD-10-CM | POA: Diagnosis not present

## 2015-11-23 DIAGNOSIS — M25661 Stiffness of right knee, not elsewhere classified: Secondary | ICD-10-CM | POA: Diagnosis not present

## 2015-11-23 DIAGNOSIS — M25561 Pain in right knee: Secondary | ICD-10-CM | POA: Diagnosis not present

## 2015-11-23 DIAGNOSIS — Z96651 Presence of right artificial knee joint: Secondary | ICD-10-CM | POA: Diagnosis not present

## 2015-11-23 DIAGNOSIS — M1711 Unilateral primary osteoarthritis, right knee: Secondary | ICD-10-CM | POA: Diagnosis not present

## 2015-11-29 DIAGNOSIS — M1711 Unilateral primary osteoarthritis, right knee: Secondary | ICD-10-CM | POA: Diagnosis not present

## 2015-11-29 DIAGNOSIS — M25561 Pain in right knee: Secondary | ICD-10-CM | POA: Diagnosis not present

## 2015-11-29 DIAGNOSIS — M25661 Stiffness of right knee, not elsewhere classified: Secondary | ICD-10-CM | POA: Diagnosis not present

## 2015-11-29 DIAGNOSIS — M6281 Muscle weakness (generalized): Secondary | ICD-10-CM | POA: Diagnosis not present

## 2015-12-01 DIAGNOSIS — M25661 Stiffness of right knee, not elsewhere classified: Secondary | ICD-10-CM | POA: Diagnosis not present

## 2015-12-01 DIAGNOSIS — M25561 Pain in right knee: Secondary | ICD-10-CM | POA: Diagnosis not present

## 2015-12-01 DIAGNOSIS — M6281 Muscle weakness (generalized): Secondary | ICD-10-CM | POA: Diagnosis not present

## 2015-12-01 DIAGNOSIS — M1711 Unilateral primary osteoarthritis, right knee: Secondary | ICD-10-CM | POA: Diagnosis not present

## 2015-12-06 DIAGNOSIS — M25561 Pain in right knee: Secondary | ICD-10-CM | POA: Diagnosis not present

## 2015-12-06 DIAGNOSIS — M25661 Stiffness of right knee, not elsewhere classified: Secondary | ICD-10-CM | POA: Diagnosis not present

## 2015-12-06 DIAGNOSIS — M1711 Unilateral primary osteoarthritis, right knee: Secondary | ICD-10-CM | POA: Diagnosis not present

## 2015-12-06 DIAGNOSIS — M6281 Muscle weakness (generalized): Secondary | ICD-10-CM | POA: Diagnosis not present

## 2015-12-12 DIAGNOSIS — M25561 Pain in right knee: Secondary | ICD-10-CM | POA: Diagnosis not present

## 2015-12-12 DIAGNOSIS — M1711 Unilateral primary osteoarthritis, right knee: Secondary | ICD-10-CM | POA: Diagnosis not present

## 2015-12-12 DIAGNOSIS — M6281 Muscle weakness (generalized): Secondary | ICD-10-CM | POA: Diagnosis not present

## 2015-12-12 DIAGNOSIS — M25661 Stiffness of right knee, not elsewhere classified: Secondary | ICD-10-CM | POA: Diagnosis not present

## 2015-12-13 ENCOUNTER — Encounter (INDEPENDENT_AMBULATORY_CARE_PROVIDER_SITE_OTHER): Payer: Medicare Other | Admitting: Ophthalmology

## 2015-12-13 DIAGNOSIS — H35033 Hypertensive retinopathy, bilateral: Secondary | ICD-10-CM

## 2015-12-13 DIAGNOSIS — I10 Essential (primary) hypertension: Secondary | ICD-10-CM

## 2015-12-13 DIAGNOSIS — H353211 Exudative age-related macular degeneration, right eye, with active choroidal neovascularization: Secondary | ICD-10-CM | POA: Diagnosis not present

## 2015-12-13 DIAGNOSIS — H353124 Nonexudative age-related macular degeneration, left eye, advanced atrophic with subfoveal involvement: Secondary | ICD-10-CM

## 2015-12-13 DIAGNOSIS — H43813 Vitreous degeneration, bilateral: Secondary | ICD-10-CM | POA: Diagnosis not present

## 2015-12-15 DIAGNOSIS — N401 Enlarged prostate with lower urinary tract symptoms: Secondary | ICD-10-CM | POA: Diagnosis not present

## 2015-12-15 DIAGNOSIS — Z0000 Encounter for general adult medical examination without abnormal findings: Secondary | ICD-10-CM | POA: Diagnosis not present

## 2015-12-15 DIAGNOSIS — E1165 Type 2 diabetes mellitus with hyperglycemia: Secondary | ICD-10-CM | POA: Diagnosis not present

## 2015-12-15 DIAGNOSIS — D649 Anemia, unspecified: Secondary | ICD-10-CM | POA: Diagnosis not present

## 2015-12-21 DIAGNOSIS — M25561 Pain in right knee: Secondary | ICD-10-CM | POA: Diagnosis not present

## 2015-12-21 DIAGNOSIS — M1711 Unilateral primary osteoarthritis, right knee: Secondary | ICD-10-CM | POA: Diagnosis not present

## 2015-12-21 DIAGNOSIS — M6281 Muscle weakness (generalized): Secondary | ICD-10-CM | POA: Diagnosis not present

## 2015-12-21 DIAGNOSIS — M25661 Stiffness of right knee, not elsewhere classified: Secondary | ICD-10-CM | POA: Diagnosis not present

## 2016-01-04 DIAGNOSIS — Z471 Aftercare following joint replacement surgery: Secondary | ICD-10-CM | POA: Diagnosis not present

## 2016-01-04 DIAGNOSIS — Z96651 Presence of right artificial knee joint: Secondary | ICD-10-CM | POA: Diagnosis not present

## 2016-01-12 DIAGNOSIS — L570 Actinic keratosis: Secondary | ICD-10-CM | POA: Diagnosis not present

## 2016-01-13 ENCOUNTER — Encounter (INDEPENDENT_AMBULATORY_CARE_PROVIDER_SITE_OTHER): Payer: Medicare Other | Admitting: Ophthalmology

## 2016-01-13 DIAGNOSIS — I10 Essential (primary) hypertension: Secondary | ICD-10-CM

## 2016-01-13 DIAGNOSIS — H43813 Vitreous degeneration, bilateral: Secondary | ICD-10-CM

## 2016-01-13 DIAGNOSIS — H353211 Exudative age-related macular degeneration, right eye, with active choroidal neovascularization: Secondary | ICD-10-CM | POA: Diagnosis not present

## 2016-01-13 DIAGNOSIS — H35033 Hypertensive retinopathy, bilateral: Secondary | ICD-10-CM

## 2016-01-13 DIAGNOSIS — H353122 Nonexudative age-related macular degeneration, left eye, intermediate dry stage: Secondary | ICD-10-CM

## 2016-02-10 ENCOUNTER — Encounter (INDEPENDENT_AMBULATORY_CARE_PROVIDER_SITE_OTHER): Payer: Medicare Other | Admitting: Ophthalmology

## 2016-02-10 DIAGNOSIS — H35033 Hypertensive retinopathy, bilateral: Secondary | ICD-10-CM

## 2016-02-10 DIAGNOSIS — I10 Essential (primary) hypertension: Secondary | ICD-10-CM

## 2016-02-10 DIAGNOSIS — H353211 Exudative age-related macular degeneration, right eye, with active choroidal neovascularization: Secondary | ICD-10-CM

## 2016-02-10 DIAGNOSIS — H43813 Vitreous degeneration, bilateral: Secondary | ICD-10-CM

## 2016-02-10 DIAGNOSIS — H353122 Nonexudative age-related macular degeneration, left eye, intermediate dry stage: Secondary | ICD-10-CM

## 2016-02-23 DIAGNOSIS — M545 Low back pain: Secondary | ICD-10-CM | POA: Diagnosis not present

## 2016-03-09 ENCOUNTER — Encounter (INDEPENDENT_AMBULATORY_CARE_PROVIDER_SITE_OTHER): Payer: Medicare Other | Admitting: Ophthalmology

## 2016-03-09 DIAGNOSIS — H353211 Exudative age-related macular degeneration, right eye, with active choroidal neovascularization: Secondary | ICD-10-CM | POA: Diagnosis not present

## 2016-03-09 DIAGNOSIS — I10 Essential (primary) hypertension: Secondary | ICD-10-CM | POA: Diagnosis not present

## 2016-03-09 DIAGNOSIS — H35033 Hypertensive retinopathy, bilateral: Secondary | ICD-10-CM

## 2016-03-09 DIAGNOSIS — H43813 Vitreous degeneration, bilateral: Secondary | ICD-10-CM | POA: Diagnosis not present

## 2016-03-09 DIAGNOSIS — H353122 Nonexudative age-related macular degeneration, left eye, intermediate dry stage: Secondary | ICD-10-CM

## 2016-04-06 ENCOUNTER — Encounter (INDEPENDENT_AMBULATORY_CARE_PROVIDER_SITE_OTHER): Payer: Medicare Other | Admitting: Ophthalmology

## 2016-04-07 DIAGNOSIS — R05 Cough: Secondary | ICD-10-CM | POA: Diagnosis not present

## 2016-04-12 ENCOUNTER — Encounter (INDEPENDENT_AMBULATORY_CARE_PROVIDER_SITE_OTHER): Payer: Medicare Other | Admitting: Ophthalmology

## 2016-04-19 ENCOUNTER — Encounter (INDEPENDENT_AMBULATORY_CARE_PROVIDER_SITE_OTHER): Payer: Medicare Other | Admitting: Ophthalmology

## 2016-04-19 DIAGNOSIS — H353122 Nonexudative age-related macular degeneration, left eye, intermediate dry stage: Secondary | ICD-10-CM | POA: Diagnosis not present

## 2016-04-19 DIAGNOSIS — H35033 Hypertensive retinopathy, bilateral: Secondary | ICD-10-CM | POA: Diagnosis not present

## 2016-04-19 DIAGNOSIS — I10 Essential (primary) hypertension: Secondary | ICD-10-CM | POA: Diagnosis not present

## 2016-04-19 DIAGNOSIS — H43813 Vitreous degeneration, bilateral: Secondary | ICD-10-CM | POA: Diagnosis not present

## 2016-04-19 DIAGNOSIS — H353211 Exudative age-related macular degeneration, right eye, with active choroidal neovascularization: Secondary | ICD-10-CM | POA: Diagnosis not present

## 2016-05-13 DIAGNOSIS — I10 Essential (primary) hypertension: Secondary | ICD-10-CM | POA: Diagnosis not present

## 2016-05-16 ENCOUNTER — Encounter (INDEPENDENT_AMBULATORY_CARE_PROVIDER_SITE_OTHER): Payer: Medicare Other | Admitting: Ophthalmology

## 2016-05-16 DIAGNOSIS — H43813 Vitreous degeneration, bilateral: Secondary | ICD-10-CM

## 2016-05-16 DIAGNOSIS — H353122 Nonexudative age-related macular degeneration, left eye, intermediate dry stage: Secondary | ICD-10-CM | POA: Diagnosis not present

## 2016-05-16 DIAGNOSIS — H35033 Hypertensive retinopathy, bilateral: Secondary | ICD-10-CM | POA: Diagnosis not present

## 2016-05-16 DIAGNOSIS — H353211 Exudative age-related macular degeneration, right eye, with active choroidal neovascularization: Secondary | ICD-10-CM | POA: Diagnosis not present

## 2016-05-16 DIAGNOSIS — I10 Essential (primary) hypertension: Secondary | ICD-10-CM | POA: Diagnosis not present

## 2016-05-23 DIAGNOSIS — I10 Essential (primary) hypertension: Secondary | ICD-10-CM | POA: Diagnosis not present

## 2016-05-23 DIAGNOSIS — K2970 Gastritis, unspecified, without bleeding: Secondary | ICD-10-CM | POA: Diagnosis not present

## 2016-05-26 DIAGNOSIS — R350 Frequency of micturition: Secondary | ICD-10-CM | POA: Diagnosis not present

## 2016-06-04 DIAGNOSIS — H8113 Benign paroxysmal vertigo, bilateral: Secondary | ICD-10-CM | POA: Diagnosis not present

## 2016-06-04 DIAGNOSIS — I10 Essential (primary) hypertension: Secondary | ICD-10-CM | POA: Diagnosis not present

## 2016-06-13 ENCOUNTER — Encounter (INDEPENDENT_AMBULATORY_CARE_PROVIDER_SITE_OTHER): Payer: Medicare Other | Admitting: Ophthalmology

## 2016-06-13 DIAGNOSIS — H43813 Vitreous degeneration, bilateral: Secondary | ICD-10-CM

## 2016-06-13 DIAGNOSIS — I10 Essential (primary) hypertension: Secondary | ICD-10-CM

## 2016-06-13 DIAGNOSIS — H353122 Nonexudative age-related macular degeneration, left eye, intermediate dry stage: Secondary | ICD-10-CM

## 2016-06-13 DIAGNOSIS — H35033 Hypertensive retinopathy, bilateral: Secondary | ICD-10-CM | POA: Diagnosis not present

## 2016-06-13 DIAGNOSIS — H353211 Exudative age-related macular degeneration, right eye, with active choroidal neovascularization: Secondary | ICD-10-CM | POA: Diagnosis not present

## 2016-07-11 ENCOUNTER — Encounter (INDEPENDENT_AMBULATORY_CARE_PROVIDER_SITE_OTHER): Payer: Medicare Other | Admitting: Ophthalmology

## 2016-07-11 DIAGNOSIS — I10 Essential (primary) hypertension: Secondary | ICD-10-CM

## 2016-07-11 DIAGNOSIS — H35033 Hypertensive retinopathy, bilateral: Secondary | ICD-10-CM

## 2016-07-11 DIAGNOSIS — H43813 Vitreous degeneration, bilateral: Secondary | ICD-10-CM | POA: Diagnosis not present

## 2016-07-11 DIAGNOSIS — H353122 Nonexudative age-related macular degeneration, left eye, intermediate dry stage: Secondary | ICD-10-CM

## 2016-07-11 DIAGNOSIS — H353211 Exudative age-related macular degeneration, right eye, with active choroidal neovascularization: Secondary | ICD-10-CM

## 2016-07-17 DIAGNOSIS — L570 Actinic keratosis: Secondary | ICD-10-CM | POA: Diagnosis not present

## 2016-07-17 DIAGNOSIS — C4441 Basal cell carcinoma of skin of scalp and neck: Secondary | ICD-10-CM | POA: Diagnosis not present

## 2016-08-01 ENCOUNTER — Encounter (INDEPENDENT_AMBULATORY_CARE_PROVIDER_SITE_OTHER): Payer: Medicare Other | Admitting: Ophthalmology

## 2016-08-01 DIAGNOSIS — H35033 Hypertensive retinopathy, bilateral: Secondary | ICD-10-CM | POA: Diagnosis not present

## 2016-08-01 DIAGNOSIS — I10 Essential (primary) hypertension: Secondary | ICD-10-CM

## 2016-08-01 DIAGNOSIS — H353211 Exudative age-related macular degeneration, right eye, with active choroidal neovascularization: Secondary | ICD-10-CM

## 2016-08-01 DIAGNOSIS — H353122 Nonexudative age-related macular degeneration, left eye, intermediate dry stage: Secondary | ICD-10-CM | POA: Diagnosis not present

## 2016-08-01 DIAGNOSIS — H43813 Vitreous degeneration, bilateral: Secondary | ICD-10-CM

## 2016-08-29 ENCOUNTER — Encounter (INDEPENDENT_AMBULATORY_CARE_PROVIDER_SITE_OTHER): Payer: Medicare Other | Admitting: Ophthalmology

## 2016-09-06 ENCOUNTER — Encounter (INDEPENDENT_AMBULATORY_CARE_PROVIDER_SITE_OTHER): Payer: Medicare Other | Admitting: Ophthalmology

## 2016-09-06 DIAGNOSIS — I10 Essential (primary) hypertension: Secondary | ICD-10-CM | POA: Diagnosis not present

## 2016-09-06 DIAGNOSIS — H35033 Hypertensive retinopathy, bilateral: Secondary | ICD-10-CM

## 2016-09-06 DIAGNOSIS — H353122 Nonexudative age-related macular degeneration, left eye, intermediate dry stage: Secondary | ICD-10-CM

## 2016-09-06 DIAGNOSIS — H43813 Vitreous degeneration, bilateral: Secondary | ICD-10-CM

## 2016-09-06 DIAGNOSIS — H353211 Exudative age-related macular degeneration, right eye, with active choroidal neovascularization: Secondary | ICD-10-CM

## 2016-09-11 DIAGNOSIS — Z23 Encounter for immunization: Secondary | ICD-10-CM | POA: Diagnosis not present

## 2016-10-03 ENCOUNTER — Encounter (INDEPENDENT_AMBULATORY_CARE_PROVIDER_SITE_OTHER): Payer: Medicare Other | Admitting: Ophthalmology

## 2016-10-03 DIAGNOSIS — H43813 Vitreous degeneration, bilateral: Secondary | ICD-10-CM | POA: Diagnosis not present

## 2016-10-03 DIAGNOSIS — H35033 Hypertensive retinopathy, bilateral: Secondary | ICD-10-CM

## 2016-10-03 DIAGNOSIS — H353211 Exudative age-related macular degeneration, right eye, with active choroidal neovascularization: Secondary | ICD-10-CM | POA: Diagnosis not present

## 2016-10-03 DIAGNOSIS — H353122 Nonexudative age-related macular degeneration, left eye, intermediate dry stage: Secondary | ICD-10-CM | POA: Diagnosis not present

## 2016-10-03 DIAGNOSIS — I10 Essential (primary) hypertension: Secondary | ICD-10-CM | POA: Diagnosis not present

## 2016-10-11 DIAGNOSIS — M25562 Pain in left knee: Secondary | ICD-10-CM | POA: Diagnosis not present

## 2016-10-11 DIAGNOSIS — G8929 Other chronic pain: Secondary | ICD-10-CM | POA: Diagnosis not present

## 2016-10-11 DIAGNOSIS — Z471 Aftercare following joint replacement surgery: Secondary | ICD-10-CM | POA: Diagnosis not present

## 2016-10-11 DIAGNOSIS — M25561 Pain in right knee: Secondary | ICD-10-CM | POA: Diagnosis not present

## 2016-10-11 DIAGNOSIS — Z96651 Presence of right artificial knee joint: Secondary | ICD-10-CM | POA: Diagnosis not present

## 2016-10-17 DIAGNOSIS — N401 Enlarged prostate with lower urinary tract symptoms: Secondary | ICD-10-CM | POA: Diagnosis not present

## 2016-10-17 DIAGNOSIS — I10 Essential (primary) hypertension: Secondary | ICD-10-CM | POA: Diagnosis not present

## 2016-10-17 DIAGNOSIS — H353211 Exudative age-related macular degeneration, right eye, with active choroidal neovascularization: Secondary | ICD-10-CM | POA: Diagnosis not present

## 2016-10-29 DIAGNOSIS — Z23 Encounter for immunization: Secondary | ICD-10-CM | POA: Diagnosis not present

## 2016-10-31 ENCOUNTER — Encounter (INDEPENDENT_AMBULATORY_CARE_PROVIDER_SITE_OTHER): Payer: Medicare Other | Admitting: Ophthalmology

## 2016-10-31 DIAGNOSIS — H353122 Nonexudative age-related macular degeneration, left eye, intermediate dry stage: Secondary | ICD-10-CM | POA: Diagnosis not present

## 2016-10-31 DIAGNOSIS — H35033 Hypertensive retinopathy, bilateral: Secondary | ICD-10-CM | POA: Diagnosis not present

## 2016-10-31 DIAGNOSIS — I10 Essential (primary) hypertension: Secondary | ICD-10-CM

## 2016-10-31 DIAGNOSIS — H43813 Vitreous degeneration, bilateral: Secondary | ICD-10-CM | POA: Diagnosis not present

## 2016-10-31 DIAGNOSIS — H353211 Exudative age-related macular degeneration, right eye, with active choroidal neovascularization: Secondary | ICD-10-CM

## 2016-11-28 ENCOUNTER — Encounter (INDEPENDENT_AMBULATORY_CARE_PROVIDER_SITE_OTHER): Payer: Medicare Other | Admitting: Ophthalmology

## 2016-11-28 DIAGNOSIS — H353122 Nonexudative age-related macular degeneration, left eye, intermediate dry stage: Secondary | ICD-10-CM | POA: Diagnosis not present

## 2016-11-28 DIAGNOSIS — I10 Essential (primary) hypertension: Secondary | ICD-10-CM | POA: Diagnosis not present

## 2016-11-28 DIAGNOSIS — H35033 Hypertensive retinopathy, bilateral: Secondary | ICD-10-CM | POA: Diagnosis not present

## 2016-11-28 DIAGNOSIS — H353211 Exudative age-related macular degeneration, right eye, with active choroidal neovascularization: Secondary | ICD-10-CM

## 2016-11-28 DIAGNOSIS — H43813 Vitreous degeneration, bilateral: Secondary | ICD-10-CM | POA: Diagnosis not present

## 2016-12-14 DIAGNOSIS — N401 Enlarged prostate with lower urinary tract symptoms: Secondary | ICD-10-CM | POA: Diagnosis not present

## 2016-12-14 DIAGNOSIS — Z6822 Body mass index (BMI) 22.0-22.9, adult: Secondary | ICD-10-CM | POA: Diagnosis not present

## 2016-12-14 DIAGNOSIS — I10 Essential (primary) hypertension: Secondary | ICD-10-CM | POA: Diagnosis not present

## 2016-12-14 DIAGNOSIS — N3000 Acute cystitis without hematuria: Secondary | ICD-10-CM | POA: Diagnosis not present

## 2016-12-26 ENCOUNTER — Encounter (INDEPENDENT_AMBULATORY_CARE_PROVIDER_SITE_OTHER): Payer: Medicare Other | Admitting: Ophthalmology

## 2016-12-26 DIAGNOSIS — I10 Essential (primary) hypertension: Secondary | ICD-10-CM

## 2016-12-26 DIAGNOSIS — H43813 Vitreous degeneration, bilateral: Secondary | ICD-10-CM | POA: Diagnosis not present

## 2016-12-26 DIAGNOSIS — H353211 Exudative age-related macular degeneration, right eye, with active choroidal neovascularization: Secondary | ICD-10-CM | POA: Diagnosis not present

## 2016-12-26 DIAGNOSIS — H353122 Nonexudative age-related macular degeneration, left eye, intermediate dry stage: Secondary | ICD-10-CM

## 2016-12-26 DIAGNOSIS — H35033 Hypertensive retinopathy, bilateral: Secondary | ICD-10-CM

## 2017-01-15 DIAGNOSIS — L570 Actinic keratosis: Secondary | ICD-10-CM | POA: Diagnosis not present

## 2017-01-22 DIAGNOSIS — Z125 Encounter for screening for malignant neoplasm of prostate: Secondary | ICD-10-CM | POA: Diagnosis not present

## 2017-01-22 DIAGNOSIS — I10 Essential (primary) hypertension: Secondary | ICD-10-CM | POA: Diagnosis not present

## 2017-01-22 DIAGNOSIS — E785 Hyperlipidemia, unspecified: Secondary | ICD-10-CM | POA: Diagnosis not present

## 2017-01-22 DIAGNOSIS — Z0000 Encounter for general adult medical examination without abnormal findings: Secondary | ICD-10-CM | POA: Diagnosis not present

## 2017-01-22 DIAGNOSIS — E1129 Type 2 diabetes mellitus with other diabetic kidney complication: Secondary | ICD-10-CM | POA: Diagnosis not present

## 2017-01-22 DIAGNOSIS — R809 Proteinuria, unspecified: Secondary | ICD-10-CM | POA: Diagnosis not present

## 2017-01-22 DIAGNOSIS — Z79899 Other long term (current) drug therapy: Secondary | ICD-10-CM | POA: Diagnosis not present

## 2017-01-22 DIAGNOSIS — N401 Enlarged prostate with lower urinary tract symptoms: Secondary | ICD-10-CM | POA: Diagnosis not present

## 2017-02-06 ENCOUNTER — Encounter (INDEPENDENT_AMBULATORY_CARE_PROVIDER_SITE_OTHER): Payer: Medicare Other | Admitting: Ophthalmology

## 2017-02-06 DIAGNOSIS — H353211 Exudative age-related macular degeneration, right eye, with active choroidal neovascularization: Secondary | ICD-10-CM | POA: Diagnosis not present

## 2017-02-06 DIAGNOSIS — H353122 Nonexudative age-related macular degeneration, left eye, intermediate dry stage: Secondary | ICD-10-CM

## 2017-02-06 DIAGNOSIS — I10 Essential (primary) hypertension: Secondary | ICD-10-CM

## 2017-02-06 DIAGNOSIS — H43813 Vitreous degeneration, bilateral: Secondary | ICD-10-CM

## 2017-02-06 DIAGNOSIS — H35033 Hypertensive retinopathy, bilateral: Secondary | ICD-10-CM | POA: Diagnosis not present

## 2017-03-20 ENCOUNTER — Encounter (INDEPENDENT_AMBULATORY_CARE_PROVIDER_SITE_OTHER): Payer: Medicare Other | Admitting: Ophthalmology

## 2017-03-20 DIAGNOSIS — H35033 Hypertensive retinopathy, bilateral: Secondary | ICD-10-CM

## 2017-03-20 DIAGNOSIS — H353122 Nonexudative age-related macular degeneration, left eye, intermediate dry stage: Secondary | ICD-10-CM

## 2017-03-20 DIAGNOSIS — I10 Essential (primary) hypertension: Secondary | ICD-10-CM

## 2017-03-20 DIAGNOSIS — H43813 Vitreous degeneration, bilateral: Secondary | ICD-10-CM | POA: Diagnosis not present

## 2017-03-20 DIAGNOSIS — H353211 Exudative age-related macular degeneration, right eye, with active choroidal neovascularization: Secondary | ICD-10-CM | POA: Diagnosis not present

## 2017-05-01 ENCOUNTER — Encounter (INDEPENDENT_AMBULATORY_CARE_PROVIDER_SITE_OTHER): Payer: Medicare Other | Admitting: Ophthalmology

## 2017-05-01 DIAGNOSIS — H353122 Nonexudative age-related macular degeneration, left eye, intermediate dry stage: Secondary | ICD-10-CM

## 2017-05-01 DIAGNOSIS — H43813 Vitreous degeneration, bilateral: Secondary | ICD-10-CM | POA: Diagnosis not present

## 2017-05-01 DIAGNOSIS — I10 Essential (primary) hypertension: Secondary | ICD-10-CM | POA: Diagnosis not present

## 2017-05-01 DIAGNOSIS — H353211 Exudative age-related macular degeneration, right eye, with active choroidal neovascularization: Secondary | ICD-10-CM | POA: Diagnosis not present

## 2017-05-01 DIAGNOSIS — H35033 Hypertensive retinopathy, bilateral: Secondary | ICD-10-CM

## 2017-05-26 DIAGNOSIS — I161 Hypertensive emergency: Secondary | ICD-10-CM | POA: Diagnosis not present

## 2017-05-26 DIAGNOSIS — R42 Dizziness and giddiness: Secondary | ICD-10-CM | POA: Diagnosis not present

## 2017-05-26 DIAGNOSIS — I160 Hypertensive urgency: Secondary | ICD-10-CM | POA: Diagnosis not present

## 2017-05-29 ENCOUNTER — Encounter (INDEPENDENT_AMBULATORY_CARE_PROVIDER_SITE_OTHER): Payer: Medicare Other | Admitting: Ophthalmology

## 2017-05-29 DIAGNOSIS — H353211 Exudative age-related macular degeneration, right eye, with active choroidal neovascularization: Secondary | ICD-10-CM | POA: Diagnosis not present

## 2017-05-29 DIAGNOSIS — H35033 Hypertensive retinopathy, bilateral: Secondary | ICD-10-CM

## 2017-05-29 DIAGNOSIS — H353122 Nonexudative age-related macular degeneration, left eye, intermediate dry stage: Secondary | ICD-10-CM | POA: Diagnosis not present

## 2017-05-29 DIAGNOSIS — H43813 Vitreous degeneration, bilateral: Secondary | ICD-10-CM

## 2017-05-29 DIAGNOSIS — I10 Essential (primary) hypertension: Secondary | ICD-10-CM

## 2017-05-30 DIAGNOSIS — Z6822 Body mass index (BMI) 22.0-22.9, adult: Secondary | ICD-10-CM | POA: Diagnosis not present

## 2017-05-30 DIAGNOSIS — I10 Essential (primary) hypertension: Secondary | ICD-10-CM | POA: Diagnosis not present

## 2017-05-30 DIAGNOSIS — E1129 Type 2 diabetes mellitus with other diabetic kidney complication: Secondary | ICD-10-CM | POA: Diagnosis not present

## 2017-05-30 DIAGNOSIS — R809 Proteinuria, unspecified: Secondary | ICD-10-CM | POA: Diagnosis not present

## 2017-07-03 ENCOUNTER — Encounter (INDEPENDENT_AMBULATORY_CARE_PROVIDER_SITE_OTHER): Payer: Medicare Other | Admitting: Ophthalmology

## 2017-07-03 DIAGNOSIS — H353211 Exudative age-related macular degeneration, right eye, with active choroidal neovascularization: Secondary | ICD-10-CM | POA: Diagnosis not present

## 2017-07-03 DIAGNOSIS — H353122 Nonexudative age-related macular degeneration, left eye, intermediate dry stage: Secondary | ICD-10-CM

## 2017-07-03 DIAGNOSIS — I10 Essential (primary) hypertension: Secondary | ICD-10-CM | POA: Diagnosis not present

## 2017-07-03 DIAGNOSIS — H43813 Vitreous degeneration, bilateral: Secondary | ICD-10-CM

## 2017-07-03 DIAGNOSIS — H35033 Hypertensive retinopathy, bilateral: Secondary | ICD-10-CM

## 2017-07-16 DIAGNOSIS — L570 Actinic keratosis: Secondary | ICD-10-CM | POA: Diagnosis not present

## 2017-07-16 DIAGNOSIS — C44629 Squamous cell carcinoma of skin of left upper limb, including shoulder: Secondary | ICD-10-CM | POA: Diagnosis not present

## 2017-07-29 DIAGNOSIS — J324 Chronic pansinusitis: Secondary | ICD-10-CM | POA: Diagnosis not present

## 2017-07-31 ENCOUNTER — Encounter (INDEPENDENT_AMBULATORY_CARE_PROVIDER_SITE_OTHER): Payer: Medicare Other | Admitting: Ophthalmology

## 2017-07-31 DIAGNOSIS — H35033 Hypertensive retinopathy, bilateral: Secondary | ICD-10-CM

## 2017-07-31 DIAGNOSIS — H353211 Exudative age-related macular degeneration, right eye, with active choroidal neovascularization: Secondary | ICD-10-CM

## 2017-07-31 DIAGNOSIS — H353122 Nonexudative age-related macular degeneration, left eye, intermediate dry stage: Secondary | ICD-10-CM

## 2017-07-31 DIAGNOSIS — I10 Essential (primary) hypertension: Secondary | ICD-10-CM | POA: Diagnosis not present

## 2017-07-31 DIAGNOSIS — H43813 Vitreous degeneration, bilateral: Secondary | ICD-10-CM

## 2017-09-04 ENCOUNTER — Encounter (INDEPENDENT_AMBULATORY_CARE_PROVIDER_SITE_OTHER): Payer: Medicare Other | Admitting: Ophthalmology

## 2017-09-04 DIAGNOSIS — H43813 Vitreous degeneration, bilateral: Secondary | ICD-10-CM

## 2017-09-04 DIAGNOSIS — H353211 Exudative age-related macular degeneration, right eye, with active choroidal neovascularization: Secondary | ICD-10-CM

## 2017-09-04 DIAGNOSIS — H35033 Hypertensive retinopathy, bilateral: Secondary | ICD-10-CM | POA: Diagnosis not present

## 2017-09-04 DIAGNOSIS — H353122 Nonexudative age-related macular degeneration, left eye, intermediate dry stage: Secondary | ICD-10-CM | POA: Diagnosis not present

## 2017-09-04 DIAGNOSIS — I10 Essential (primary) hypertension: Secondary | ICD-10-CM | POA: Diagnosis not present

## 2017-10-09 ENCOUNTER — Encounter (INDEPENDENT_AMBULATORY_CARE_PROVIDER_SITE_OTHER): Payer: Medicare Other | Admitting: Ophthalmology

## 2017-10-09 DIAGNOSIS — H353211 Exudative age-related macular degeneration, right eye, with active choroidal neovascularization: Secondary | ICD-10-CM

## 2017-10-09 DIAGNOSIS — H353122 Nonexudative age-related macular degeneration, left eye, intermediate dry stage: Secondary | ICD-10-CM | POA: Diagnosis not present

## 2017-10-09 DIAGNOSIS — H35033 Hypertensive retinopathy, bilateral: Secondary | ICD-10-CM

## 2017-10-09 DIAGNOSIS — I10 Essential (primary) hypertension: Secondary | ICD-10-CM | POA: Diagnosis not present

## 2017-10-09 DIAGNOSIS — H43813 Vitreous degeneration, bilateral: Secondary | ICD-10-CM

## 2017-11-06 ENCOUNTER — Encounter (INDEPENDENT_AMBULATORY_CARE_PROVIDER_SITE_OTHER): Payer: Medicare Other | Admitting: Ophthalmology

## 2017-11-06 DIAGNOSIS — H43813 Vitreous degeneration, bilateral: Secondary | ICD-10-CM | POA: Diagnosis not present

## 2017-11-06 DIAGNOSIS — I10 Essential (primary) hypertension: Secondary | ICD-10-CM | POA: Diagnosis not present

## 2017-11-06 DIAGNOSIS — H35033 Hypertensive retinopathy, bilateral: Secondary | ICD-10-CM

## 2017-11-06 DIAGNOSIS — H353211 Exudative age-related macular degeneration, right eye, with active choroidal neovascularization: Secondary | ICD-10-CM | POA: Diagnosis not present

## 2017-11-06 DIAGNOSIS — H353122 Nonexudative age-related macular degeneration, left eye, intermediate dry stage: Secondary | ICD-10-CM | POA: Diagnosis not present

## 2017-12-11 ENCOUNTER — Encounter (INDEPENDENT_AMBULATORY_CARE_PROVIDER_SITE_OTHER): Payer: Medicare Other | Admitting: Ophthalmology

## 2017-12-11 DIAGNOSIS — H353122 Nonexudative age-related macular degeneration, left eye, intermediate dry stage: Secondary | ICD-10-CM

## 2017-12-11 DIAGNOSIS — H43813 Vitreous degeneration, bilateral: Secondary | ICD-10-CM | POA: Diagnosis not present

## 2017-12-11 DIAGNOSIS — I10 Essential (primary) hypertension: Secondary | ICD-10-CM | POA: Diagnosis not present

## 2017-12-11 DIAGNOSIS — H353211 Exudative age-related macular degeneration, right eye, with active choroidal neovascularization: Secondary | ICD-10-CM | POA: Diagnosis not present

## 2017-12-11 DIAGNOSIS — H35033 Hypertensive retinopathy, bilateral: Secondary | ICD-10-CM

## 2018-01-03 DIAGNOSIS — K5651 Intestinal adhesions [bands], with partial obstruction: Secondary | ICD-10-CM | POA: Diagnosis not present

## 2018-01-03 DIAGNOSIS — R109 Unspecified abdominal pain: Secondary | ICD-10-CM | POA: Diagnosis not present

## 2018-01-03 DIAGNOSIS — I10 Essential (primary) hypertension: Secondary | ICD-10-CM | POA: Diagnosis not present

## 2018-01-03 DIAGNOSIS — R112 Nausea with vomiting, unspecified: Secondary | ICD-10-CM | POA: Diagnosis not present

## 2018-01-03 DIAGNOSIS — R1033 Periumbilical pain: Secondary | ICD-10-CM | POA: Diagnosis not present

## 2018-01-03 DIAGNOSIS — K565 Intestinal adhesions [bands], unspecified as to partial versus complete obstruction: Secondary | ICD-10-CM | POA: Diagnosis not present

## 2018-01-03 DIAGNOSIS — Z79891 Long term (current) use of opiate analgesic: Secondary | ICD-10-CM | POA: Diagnosis not present

## 2018-01-03 DIAGNOSIS — K56609 Unspecified intestinal obstruction, unspecified as to partial versus complete obstruction: Secondary | ICD-10-CM | POA: Diagnosis not present

## 2018-01-03 DIAGNOSIS — K5669 Other partial intestinal obstruction: Secondary | ICD-10-CM | POA: Diagnosis not present

## 2018-01-03 DIAGNOSIS — R1110 Vomiting, unspecified: Secondary | ICD-10-CM | POA: Diagnosis not present

## 2018-01-03 DIAGNOSIS — Z79899 Other long term (current) drug therapy: Secondary | ICD-10-CM | POA: Diagnosis not present

## 2018-01-14 DIAGNOSIS — L570 Actinic keratosis: Secondary | ICD-10-CM | POA: Diagnosis not present

## 2018-01-15 ENCOUNTER — Encounter (INDEPENDENT_AMBULATORY_CARE_PROVIDER_SITE_OTHER): Payer: Medicare Other | Admitting: Ophthalmology

## 2018-01-15 DIAGNOSIS — I10 Essential (primary) hypertension: Secondary | ICD-10-CM

## 2018-01-15 DIAGNOSIS — H353211 Exudative age-related macular degeneration, right eye, with active choroidal neovascularization: Secondary | ICD-10-CM | POA: Diagnosis not present

## 2018-01-15 DIAGNOSIS — H43813 Vitreous degeneration, bilateral: Secondary | ICD-10-CM

## 2018-01-15 DIAGNOSIS — H35033 Hypertensive retinopathy, bilateral: Secondary | ICD-10-CM

## 2018-01-15 DIAGNOSIS — H353122 Nonexudative age-related macular degeneration, left eye, intermediate dry stage: Secondary | ICD-10-CM

## 2018-01-29 DIAGNOSIS — Z09 Encounter for follow-up examination after completed treatment for conditions other than malignant neoplasm: Secondary | ICD-10-CM | POA: Diagnosis not present

## 2018-02-17 ENCOUNTER — Encounter (INDEPENDENT_AMBULATORY_CARE_PROVIDER_SITE_OTHER): Payer: Medicare Other | Admitting: Ophthalmology

## 2018-02-17 DIAGNOSIS — H353211 Exudative age-related macular degeneration, right eye, with active choroidal neovascularization: Secondary | ICD-10-CM | POA: Diagnosis not present

## 2018-02-17 DIAGNOSIS — H353122 Nonexudative age-related macular degeneration, left eye, intermediate dry stage: Secondary | ICD-10-CM | POA: Diagnosis not present

## 2018-02-17 DIAGNOSIS — H43813 Vitreous degeneration, bilateral: Secondary | ICD-10-CM

## 2018-02-17 DIAGNOSIS — H35033 Hypertensive retinopathy, bilateral: Secondary | ICD-10-CM | POA: Diagnosis not present

## 2018-02-17 DIAGNOSIS — I10 Essential (primary) hypertension: Secondary | ICD-10-CM | POA: Diagnosis not present

## 2018-02-27 DIAGNOSIS — C44311 Basal cell carcinoma of skin of nose: Secondary | ICD-10-CM | POA: Diagnosis not present

## 2018-03-05 DIAGNOSIS — C44311 Basal cell carcinoma of skin of nose: Secondary | ICD-10-CM | POA: Diagnosis not present

## 2018-03-25 ENCOUNTER — Encounter (INDEPENDENT_AMBULATORY_CARE_PROVIDER_SITE_OTHER): Payer: Medicare Other | Admitting: Ophthalmology

## 2018-03-27 DIAGNOSIS — C44311 Basal cell carcinoma of skin of nose: Secondary | ICD-10-CM | POA: Diagnosis not present

## 2018-04-02 ENCOUNTER — Encounter (INDEPENDENT_AMBULATORY_CARE_PROVIDER_SITE_OTHER): Payer: Medicare Other | Admitting: Ophthalmology

## 2018-04-02 DIAGNOSIS — I10 Essential (primary) hypertension: Secondary | ICD-10-CM

## 2018-04-02 DIAGNOSIS — H353211 Exudative age-related macular degeneration, right eye, with active choroidal neovascularization: Secondary | ICD-10-CM

## 2018-04-02 DIAGNOSIS — H35033 Hypertensive retinopathy, bilateral: Secondary | ICD-10-CM

## 2018-04-02 DIAGNOSIS — H353122 Nonexudative age-related macular degeneration, left eye, intermediate dry stage: Secondary | ICD-10-CM

## 2018-04-02 DIAGNOSIS — H43813 Vitreous degeneration, bilateral: Secondary | ICD-10-CM

## 2018-04-17 DIAGNOSIS — L820 Inflamed seborrheic keratosis: Secondary | ICD-10-CM | POA: Diagnosis not present

## 2018-04-22 DIAGNOSIS — K921 Melena: Secondary | ICD-10-CM | POA: Diagnosis not present

## 2018-04-22 DIAGNOSIS — E1129 Type 2 diabetes mellitus with other diabetic kidney complication: Secondary | ICD-10-CM | POA: Diagnosis not present

## 2018-04-22 DIAGNOSIS — Z6821 Body mass index (BMI) 21.0-21.9, adult: Secondary | ICD-10-CM | POA: Diagnosis not present

## 2018-04-22 DIAGNOSIS — I10 Essential (primary) hypertension: Secondary | ICD-10-CM | POA: Diagnosis not present

## 2018-04-22 DIAGNOSIS — Z1339 Encounter for screening examination for other mental health and behavioral disorders: Secondary | ICD-10-CM | POA: Diagnosis not present

## 2018-04-22 DIAGNOSIS — Z79899 Other long term (current) drug therapy: Secondary | ICD-10-CM | POA: Diagnosis not present

## 2018-04-22 DIAGNOSIS — Z0000 Encounter for general adult medical examination without abnormal findings: Secondary | ICD-10-CM | POA: Diagnosis not present

## 2018-04-30 ENCOUNTER — Encounter (INDEPENDENT_AMBULATORY_CARE_PROVIDER_SITE_OTHER): Payer: Medicare Other | Admitting: Ophthalmology

## 2018-04-30 DIAGNOSIS — H353211 Exudative age-related macular degeneration, right eye, with active choroidal neovascularization: Secondary | ICD-10-CM | POA: Diagnosis not present

## 2018-04-30 DIAGNOSIS — H35033 Hypertensive retinopathy, bilateral: Secondary | ICD-10-CM | POA: Diagnosis not present

## 2018-04-30 DIAGNOSIS — I10 Essential (primary) hypertension: Secondary | ICD-10-CM

## 2018-04-30 DIAGNOSIS — H43813 Vitreous degeneration, bilateral: Secondary | ICD-10-CM

## 2018-04-30 DIAGNOSIS — H353122 Nonexudative age-related macular degeneration, left eye, intermediate dry stage: Secondary | ICD-10-CM | POA: Diagnosis not present

## 2018-05-11 DIAGNOSIS — I10 Essential (primary) hypertension: Secondary | ICD-10-CM | POA: Diagnosis not present

## 2018-05-11 DIAGNOSIS — K5669 Other partial intestinal obstruction: Secondary | ICD-10-CM | POA: Diagnosis not present

## 2018-05-11 DIAGNOSIS — K5651 Intestinal adhesions [bands], with partial obstruction: Secondary | ICD-10-CM | POA: Diagnosis not present

## 2018-05-11 DIAGNOSIS — K5730 Diverticulosis of large intestine without perforation or abscess without bleeding: Secondary | ICD-10-CM | POA: Diagnosis not present

## 2018-05-11 DIAGNOSIS — K56609 Unspecified intestinal obstruction, unspecified as to partial versus complete obstruction: Secondary | ICD-10-CM | POA: Diagnosis not present

## 2018-05-11 DIAGNOSIS — Z79899 Other long term (current) drug therapy: Secondary | ICD-10-CM | POA: Diagnosis not present

## 2018-05-11 DIAGNOSIS — K8020 Calculus of gallbladder without cholecystitis without obstruction: Secondary | ICD-10-CM | POA: Diagnosis not present

## 2018-05-19 DIAGNOSIS — Z7189 Other specified counseling: Secondary | ICD-10-CM | POA: Diagnosis not present

## 2018-05-19 DIAGNOSIS — Z1331 Encounter for screening for depression: Secondary | ICD-10-CM | POA: Diagnosis not present

## 2018-05-19 DIAGNOSIS — Z6821 Body mass index (BMI) 21.0-21.9, adult: Secondary | ICD-10-CM | POA: Diagnosis not present

## 2018-05-19 DIAGNOSIS — Z9181 History of falling: Secondary | ICD-10-CM | POA: Diagnosis not present

## 2018-05-22 DIAGNOSIS — K56601 Complete intestinal obstruction, unspecified as to cause: Secondary | ICD-10-CM | POA: Diagnosis not present

## 2018-05-27 ENCOUNTER — Encounter (INDEPENDENT_AMBULATORY_CARE_PROVIDER_SITE_OTHER): Payer: Medicare Other | Admitting: Ophthalmology

## 2018-05-27 DIAGNOSIS — I10 Essential (primary) hypertension: Secondary | ICD-10-CM | POA: Diagnosis not present

## 2018-05-27 DIAGNOSIS — H43813 Vitreous degeneration, bilateral: Secondary | ICD-10-CM | POA: Diagnosis not present

## 2018-05-27 DIAGNOSIS — H353122 Nonexudative age-related macular degeneration, left eye, intermediate dry stage: Secondary | ICD-10-CM | POA: Diagnosis not present

## 2018-05-27 DIAGNOSIS — H35033 Hypertensive retinopathy, bilateral: Secondary | ICD-10-CM | POA: Diagnosis not present

## 2018-05-27 DIAGNOSIS — H353211 Exudative age-related macular degeneration, right eye, with active choroidal neovascularization: Secondary | ICD-10-CM | POA: Diagnosis not present

## 2018-06-24 ENCOUNTER — Encounter (INDEPENDENT_AMBULATORY_CARE_PROVIDER_SITE_OTHER): Payer: Medicare Other | Admitting: Ophthalmology

## 2018-06-24 DIAGNOSIS — H43813 Vitreous degeneration, bilateral: Secondary | ICD-10-CM | POA: Diagnosis not present

## 2018-06-24 DIAGNOSIS — H35033 Hypertensive retinopathy, bilateral: Secondary | ICD-10-CM | POA: Diagnosis not present

## 2018-06-24 DIAGNOSIS — H353122 Nonexudative age-related macular degeneration, left eye, intermediate dry stage: Secondary | ICD-10-CM | POA: Diagnosis not present

## 2018-06-24 DIAGNOSIS — H353211 Exudative age-related macular degeneration, right eye, with active choroidal neovascularization: Secondary | ICD-10-CM

## 2018-06-24 DIAGNOSIS — I10 Essential (primary) hypertension: Secondary | ICD-10-CM

## 2018-06-27 ENCOUNTER — Other Ambulatory Visit: Payer: Self-pay

## 2018-06-30 DIAGNOSIS — Z6821 Body mass index (BMI) 21.0-21.9, adult: Secondary | ICD-10-CM | POA: Diagnosis not present

## 2018-06-30 DIAGNOSIS — I951 Orthostatic hypotension: Secondary | ICD-10-CM | POA: Diagnosis not present

## 2018-07-07 DIAGNOSIS — H6983 Other specified disorders of Eustachian tube, bilateral: Secondary | ICD-10-CM | POA: Diagnosis not present

## 2018-07-07 DIAGNOSIS — Z6821 Body mass index (BMI) 21.0-21.9, adult: Secondary | ICD-10-CM | POA: Diagnosis not present

## 2018-07-07 DIAGNOSIS — I951 Orthostatic hypotension: Secondary | ICD-10-CM | POA: Diagnosis not present

## 2018-07-07 DIAGNOSIS — I10 Essential (primary) hypertension: Secondary | ICD-10-CM | POA: Diagnosis not present

## 2018-07-22 ENCOUNTER — Encounter (INDEPENDENT_AMBULATORY_CARE_PROVIDER_SITE_OTHER): Payer: Medicare Other | Admitting: Ophthalmology

## 2018-07-22 DIAGNOSIS — H353211 Exudative age-related macular degeneration, right eye, with active choroidal neovascularization: Secondary | ICD-10-CM | POA: Diagnosis not present

## 2018-07-22 DIAGNOSIS — H35033 Hypertensive retinopathy, bilateral: Secondary | ICD-10-CM

## 2018-07-22 DIAGNOSIS — H353121 Nonexudative age-related macular degeneration, left eye, early dry stage: Secondary | ICD-10-CM | POA: Diagnosis not present

## 2018-07-22 DIAGNOSIS — I10 Essential (primary) hypertension: Secondary | ICD-10-CM | POA: Diagnosis not present

## 2018-07-22 DIAGNOSIS — H43813 Vitreous degeneration, bilateral: Secondary | ICD-10-CM

## 2018-08-01 DIAGNOSIS — I10 Essential (primary) hypertension: Secondary | ICD-10-CM | POA: Diagnosis not present

## 2018-08-01 DIAGNOSIS — Z6821 Body mass index (BMI) 21.0-21.9, adult: Secondary | ICD-10-CM | POA: Diagnosis not present

## 2018-08-01 DIAGNOSIS — R42 Dizziness and giddiness: Secondary | ICD-10-CM | POA: Diagnosis not present

## 2018-08-01 DIAGNOSIS — R2681 Unsteadiness on feet: Secondary | ICD-10-CM | POA: Diagnosis not present

## 2018-08-14 DIAGNOSIS — R2681 Unsteadiness on feet: Secondary | ICD-10-CM | POA: Diagnosis not present

## 2018-08-14 DIAGNOSIS — R270 Ataxia, unspecified: Secondary | ICD-10-CM | POA: Diagnosis not present

## 2018-08-18 DIAGNOSIS — I951 Orthostatic hypotension: Secondary | ICD-10-CM | POA: Diagnosis not present

## 2018-08-18 DIAGNOSIS — Z6821 Body mass index (BMI) 21.0-21.9, adult: Secondary | ICD-10-CM | POA: Diagnosis not present

## 2018-08-18 DIAGNOSIS — I10 Essential (primary) hypertension: Secondary | ICD-10-CM | POA: Diagnosis not present

## 2018-08-18 DIAGNOSIS — H6983 Other specified disorders of Eustachian tube, bilateral: Secondary | ICD-10-CM | POA: Diagnosis not present

## 2018-08-19 ENCOUNTER — Encounter (INDEPENDENT_AMBULATORY_CARE_PROVIDER_SITE_OTHER): Payer: Medicare Other | Admitting: Ophthalmology

## 2018-08-19 DIAGNOSIS — H35033 Hypertensive retinopathy, bilateral: Secondary | ICD-10-CM

## 2018-08-19 DIAGNOSIS — H353122 Nonexudative age-related macular degeneration, left eye, intermediate dry stage: Secondary | ICD-10-CM | POA: Diagnosis not present

## 2018-08-19 DIAGNOSIS — I10 Essential (primary) hypertension: Secondary | ICD-10-CM | POA: Diagnosis not present

## 2018-08-19 DIAGNOSIS — H43813 Vitreous degeneration, bilateral: Secondary | ICD-10-CM

## 2018-08-19 DIAGNOSIS — H353211 Exudative age-related macular degeneration, right eye, with active choroidal neovascularization: Secondary | ICD-10-CM | POA: Diagnosis not present

## 2018-09-16 ENCOUNTER — Encounter (INDEPENDENT_AMBULATORY_CARE_PROVIDER_SITE_OTHER): Payer: Medicare Other | Admitting: Ophthalmology

## 2018-09-16 DIAGNOSIS — H353211 Exudative age-related macular degeneration, right eye, with active choroidal neovascularization: Secondary | ICD-10-CM | POA: Diagnosis not present

## 2018-09-16 DIAGNOSIS — I10 Essential (primary) hypertension: Secondary | ICD-10-CM

## 2018-09-16 DIAGNOSIS — H43813 Vitreous degeneration, bilateral: Secondary | ICD-10-CM

## 2018-09-16 DIAGNOSIS — H35033 Hypertensive retinopathy, bilateral: Secondary | ICD-10-CM

## 2018-09-16 DIAGNOSIS — H353122 Nonexudative age-related macular degeneration, left eye, intermediate dry stage: Secondary | ICD-10-CM

## 2018-10-13 ENCOUNTER — Encounter (INDEPENDENT_AMBULATORY_CARE_PROVIDER_SITE_OTHER): Payer: Medicare Other | Admitting: Ophthalmology

## 2018-10-13 DIAGNOSIS — H353211 Exudative age-related macular degeneration, right eye, with active choroidal neovascularization: Secondary | ICD-10-CM | POA: Diagnosis not present

## 2018-10-13 DIAGNOSIS — I10 Essential (primary) hypertension: Secondary | ICD-10-CM

## 2018-10-13 DIAGNOSIS — H35033 Hypertensive retinopathy, bilateral: Secondary | ICD-10-CM | POA: Diagnosis not present

## 2018-10-13 DIAGNOSIS — H353122 Nonexudative age-related macular degeneration, left eye, intermediate dry stage: Secondary | ICD-10-CM

## 2018-10-13 DIAGNOSIS — H43813 Vitreous degeneration, bilateral: Secondary | ICD-10-CM | POA: Diagnosis not present

## 2018-11-10 ENCOUNTER — Encounter (INDEPENDENT_AMBULATORY_CARE_PROVIDER_SITE_OTHER): Payer: Medicare Other | Admitting: Ophthalmology

## 2018-11-10 ENCOUNTER — Other Ambulatory Visit: Payer: Self-pay

## 2018-11-11 DIAGNOSIS — D485 Neoplasm of uncertain behavior of skin: Secondary | ICD-10-CM | POA: Diagnosis not present

## 2018-11-11 DIAGNOSIS — C4442 Squamous cell carcinoma of skin of scalp and neck: Secondary | ICD-10-CM | POA: Diagnosis not present

## 2018-11-11 DIAGNOSIS — C4441 Basal cell carcinoma of skin of scalp and neck: Secondary | ICD-10-CM | POA: Diagnosis not present

## 2018-11-18 DIAGNOSIS — C4441 Basal cell carcinoma of skin of scalp and neck: Secondary | ICD-10-CM | POA: Diagnosis not present

## 2018-12-08 ENCOUNTER — Encounter (INDEPENDENT_AMBULATORY_CARE_PROVIDER_SITE_OTHER): Payer: Medicare Other | Admitting: Ophthalmology

## 2018-12-08 ENCOUNTER — Other Ambulatory Visit: Payer: Self-pay

## 2018-12-08 DIAGNOSIS — H353122 Nonexudative age-related macular degeneration, left eye, intermediate dry stage: Secondary | ICD-10-CM

## 2018-12-08 DIAGNOSIS — I10 Essential (primary) hypertension: Secondary | ICD-10-CM

## 2018-12-08 DIAGNOSIS — H43813 Vitreous degeneration, bilateral: Secondary | ICD-10-CM

## 2018-12-08 DIAGNOSIS — H35033 Hypertensive retinopathy, bilateral: Secondary | ICD-10-CM | POA: Diagnosis not present

## 2018-12-08 DIAGNOSIS — H353211 Exudative age-related macular degeneration, right eye, with active choroidal neovascularization: Secondary | ICD-10-CM

## 2019-01-06 ENCOUNTER — Encounter (INDEPENDENT_AMBULATORY_CARE_PROVIDER_SITE_OTHER): Payer: Medicare Other | Admitting: Ophthalmology

## 2019-01-06 ENCOUNTER — Other Ambulatory Visit: Payer: Self-pay

## 2019-01-06 DIAGNOSIS — H35033 Hypertensive retinopathy, bilateral: Secondary | ICD-10-CM

## 2019-01-06 DIAGNOSIS — H43813 Vitreous degeneration, bilateral: Secondary | ICD-10-CM

## 2019-01-06 DIAGNOSIS — I10 Essential (primary) hypertension: Secondary | ICD-10-CM

## 2019-01-06 DIAGNOSIS — H353211 Exudative age-related macular degeneration, right eye, with active choroidal neovascularization: Secondary | ICD-10-CM | POA: Diagnosis not present

## 2019-01-06 DIAGNOSIS — H353122 Nonexudative age-related macular degeneration, left eye, intermediate dry stage: Secondary | ICD-10-CM

## 2019-02-02 ENCOUNTER — Encounter (INDEPENDENT_AMBULATORY_CARE_PROVIDER_SITE_OTHER): Payer: Medicare Other | Admitting: Ophthalmology

## 2019-02-02 ENCOUNTER — Other Ambulatory Visit: Payer: Self-pay

## 2019-02-02 DIAGNOSIS — H43813 Vitreous degeneration, bilateral: Secondary | ICD-10-CM | POA: Diagnosis not present

## 2019-02-02 DIAGNOSIS — H353211 Exudative age-related macular degeneration, right eye, with active choroidal neovascularization: Secondary | ICD-10-CM | POA: Diagnosis not present

## 2019-02-02 DIAGNOSIS — I10 Essential (primary) hypertension: Secondary | ICD-10-CM | POA: Diagnosis not present

## 2019-02-02 DIAGNOSIS — H35033 Hypertensive retinopathy, bilateral: Secondary | ICD-10-CM | POA: Diagnosis not present

## 2019-02-02 DIAGNOSIS — H353122 Nonexudative age-related macular degeneration, left eye, intermediate dry stage: Secondary | ICD-10-CM | POA: Diagnosis not present

## 2019-03-02 ENCOUNTER — Other Ambulatory Visit: Payer: Self-pay

## 2019-03-02 ENCOUNTER — Encounter (INDEPENDENT_AMBULATORY_CARE_PROVIDER_SITE_OTHER): Payer: Medicare Other | Admitting: Ophthalmology

## 2019-03-02 DIAGNOSIS — H353122 Nonexudative age-related macular degeneration, left eye, intermediate dry stage: Secondary | ICD-10-CM

## 2019-03-02 DIAGNOSIS — H43813 Vitreous degeneration, bilateral: Secondary | ICD-10-CM

## 2019-03-02 DIAGNOSIS — I10 Essential (primary) hypertension: Secondary | ICD-10-CM | POA: Diagnosis not present

## 2019-03-02 DIAGNOSIS — H353211 Exudative age-related macular degeneration, right eye, with active choroidal neovascularization: Secondary | ICD-10-CM | POA: Diagnosis not present

## 2019-03-02 DIAGNOSIS — H35033 Hypertensive retinopathy, bilateral: Secondary | ICD-10-CM

## 2019-03-27 DIAGNOSIS — Z6821 Body mass index (BMI) 21.0-21.9, adult: Secondary | ICD-10-CM | POA: Diagnosis not present

## 2019-03-27 DIAGNOSIS — N3000 Acute cystitis without hematuria: Secondary | ICD-10-CM | POA: Diagnosis not present

## 2019-03-30 ENCOUNTER — Encounter (INDEPENDENT_AMBULATORY_CARE_PROVIDER_SITE_OTHER): Payer: Medicare Other | Admitting: Ophthalmology

## 2019-03-30 ENCOUNTER — Other Ambulatory Visit: Payer: Self-pay

## 2019-03-30 DIAGNOSIS — H35033 Hypertensive retinopathy, bilateral: Secondary | ICD-10-CM | POA: Diagnosis not present

## 2019-03-30 DIAGNOSIS — I10 Essential (primary) hypertension: Secondary | ICD-10-CM | POA: Diagnosis not present

## 2019-03-30 DIAGNOSIS — H353211 Exudative age-related macular degeneration, right eye, with active choroidal neovascularization: Secondary | ICD-10-CM

## 2019-03-30 DIAGNOSIS — H43813 Vitreous degeneration, bilateral: Secondary | ICD-10-CM

## 2019-03-30 DIAGNOSIS — H353122 Nonexudative age-related macular degeneration, left eye, intermediate dry stage: Secondary | ICD-10-CM

## 2019-04-02 DIAGNOSIS — R140 Abdominal distension (gaseous): Secondary | ICD-10-CM | POA: Diagnosis not present

## 2019-04-02 DIAGNOSIS — R1084 Generalized abdominal pain: Secondary | ICD-10-CM | POA: Diagnosis not present

## 2019-04-09 DIAGNOSIS — Z6822 Body mass index (BMI) 22.0-22.9, adult: Secondary | ICD-10-CM | POA: Diagnosis not present

## 2019-04-09 DIAGNOSIS — N3000 Acute cystitis without hematuria: Secondary | ICD-10-CM | POA: Diagnosis not present

## 2019-04-27 ENCOUNTER — Encounter (INDEPENDENT_AMBULATORY_CARE_PROVIDER_SITE_OTHER): Payer: Medicare Other | Admitting: Ophthalmology

## 2019-04-27 ENCOUNTER — Other Ambulatory Visit: Payer: Self-pay

## 2019-04-27 DIAGNOSIS — H43813 Vitreous degeneration, bilateral: Secondary | ICD-10-CM | POA: Diagnosis not present

## 2019-04-27 DIAGNOSIS — H35033 Hypertensive retinopathy, bilateral: Secondary | ICD-10-CM

## 2019-04-27 DIAGNOSIS — H353211 Exudative age-related macular degeneration, right eye, with active choroidal neovascularization: Secondary | ICD-10-CM | POA: Diagnosis not present

## 2019-04-27 DIAGNOSIS — H353122 Nonexudative age-related macular degeneration, left eye, intermediate dry stage: Secondary | ICD-10-CM | POA: Diagnosis not present

## 2019-04-27 DIAGNOSIS — I10 Essential (primary) hypertension: Secondary | ICD-10-CM | POA: Diagnosis not present

## 2019-05-08 DIAGNOSIS — E1129 Type 2 diabetes mellitus with other diabetic kidney complication: Secondary | ICD-10-CM | POA: Diagnosis not present

## 2019-05-08 DIAGNOSIS — I10 Essential (primary) hypertension: Secondary | ICD-10-CM | POA: Diagnosis not present

## 2019-05-08 DIAGNOSIS — N401 Enlarged prostate with lower urinary tract symptoms: Secondary | ICD-10-CM | POA: Diagnosis not present

## 2019-05-08 DIAGNOSIS — Z0000 Encounter for general adult medical examination without abnormal findings: Secondary | ICD-10-CM | POA: Diagnosis not present

## 2019-05-08 DIAGNOSIS — E7800 Pure hypercholesterolemia, unspecified: Secondary | ICD-10-CM | POA: Diagnosis not present

## 2019-05-12 DIAGNOSIS — E559 Vitamin D deficiency, unspecified: Secondary | ICD-10-CM | POA: Diagnosis not present

## 2019-05-12 DIAGNOSIS — Z79899 Other long term (current) drug therapy: Secondary | ICD-10-CM | POA: Diagnosis not present

## 2019-05-12 DIAGNOSIS — E1129 Type 2 diabetes mellitus with other diabetic kidney complication: Secondary | ICD-10-CM | POA: Diagnosis not present

## 2019-05-12 DIAGNOSIS — E785 Hyperlipidemia, unspecified: Secondary | ICD-10-CM | POA: Diagnosis not present

## 2019-05-12 DIAGNOSIS — Z125 Encounter for screening for malignant neoplasm of prostate: Secondary | ICD-10-CM | POA: Diagnosis not present

## 2019-05-16 DIAGNOSIS — L570 Actinic keratosis: Secondary | ICD-10-CM | POA: Diagnosis not present

## 2019-05-16 DIAGNOSIS — L821 Other seborrheic keratosis: Secondary | ICD-10-CM | POA: Diagnosis not present

## 2019-05-25 ENCOUNTER — Encounter (INDEPENDENT_AMBULATORY_CARE_PROVIDER_SITE_OTHER): Payer: Medicare Other | Admitting: Ophthalmology

## 2019-05-25 DIAGNOSIS — H43813 Vitreous degeneration, bilateral: Secondary | ICD-10-CM | POA: Diagnosis not present

## 2019-05-25 DIAGNOSIS — H353211 Exudative age-related macular degeneration, right eye, with active choroidal neovascularization: Secondary | ICD-10-CM

## 2019-05-25 DIAGNOSIS — H353122 Nonexudative age-related macular degeneration, left eye, intermediate dry stage: Secondary | ICD-10-CM

## 2019-05-25 DIAGNOSIS — I10 Essential (primary) hypertension: Secondary | ICD-10-CM | POA: Diagnosis not present

## 2019-05-25 DIAGNOSIS — H35033 Hypertensive retinopathy, bilateral: Secondary | ICD-10-CM | POA: Diagnosis not present

## 2019-06-05 DIAGNOSIS — Z23 Encounter for immunization: Secondary | ICD-10-CM | POA: Diagnosis not present

## 2019-06-22 ENCOUNTER — Encounter (INDEPENDENT_AMBULATORY_CARE_PROVIDER_SITE_OTHER): Payer: Medicare Other | Admitting: Ophthalmology

## 2019-06-22 DIAGNOSIS — H353211 Exudative age-related macular degeneration, right eye, with active choroidal neovascularization: Secondary | ICD-10-CM | POA: Diagnosis not present

## 2019-06-22 DIAGNOSIS — I10 Essential (primary) hypertension: Secondary | ICD-10-CM | POA: Diagnosis not present

## 2019-06-22 DIAGNOSIS — H353122 Nonexudative age-related macular degeneration, left eye, intermediate dry stage: Secondary | ICD-10-CM

## 2019-06-22 DIAGNOSIS — H35033 Hypertensive retinopathy, bilateral: Secondary | ICD-10-CM | POA: Diagnosis not present

## 2019-06-22 DIAGNOSIS — H43813 Vitreous degeneration, bilateral: Secondary | ICD-10-CM

## 2019-07-08 ENCOUNTER — Other Ambulatory Visit: Payer: Self-pay

## 2019-07-20 ENCOUNTER — Encounter (INDEPENDENT_AMBULATORY_CARE_PROVIDER_SITE_OTHER): Payer: Medicare Other | Admitting: Ophthalmology

## 2019-07-20 DIAGNOSIS — H353122 Nonexudative age-related macular degeneration, left eye, intermediate dry stage: Secondary | ICD-10-CM

## 2019-07-20 DIAGNOSIS — H35033 Hypertensive retinopathy, bilateral: Secondary | ICD-10-CM

## 2019-07-20 DIAGNOSIS — I10 Essential (primary) hypertension: Secondary | ICD-10-CM

## 2019-07-20 DIAGNOSIS — H353211 Exudative age-related macular degeneration, right eye, with active choroidal neovascularization: Secondary | ICD-10-CM

## 2019-07-20 DIAGNOSIS — H43813 Vitreous degeneration, bilateral: Secondary | ICD-10-CM

## 2019-08-17 ENCOUNTER — Encounter (INDEPENDENT_AMBULATORY_CARE_PROVIDER_SITE_OTHER): Payer: Medicare Other | Admitting: Ophthalmology

## 2019-08-17 DIAGNOSIS — H353122 Nonexudative age-related macular degeneration, left eye, intermediate dry stage: Secondary | ICD-10-CM

## 2019-08-17 DIAGNOSIS — I10 Essential (primary) hypertension: Secondary | ICD-10-CM

## 2019-08-17 DIAGNOSIS — H35033 Hypertensive retinopathy, bilateral: Secondary | ICD-10-CM

## 2019-08-17 DIAGNOSIS — H43813 Vitreous degeneration, bilateral: Secondary | ICD-10-CM

## 2019-08-17 DIAGNOSIS — H353211 Exudative age-related macular degeneration, right eye, with active choroidal neovascularization: Secondary | ICD-10-CM

## 2019-09-14 ENCOUNTER — Encounter (INDEPENDENT_AMBULATORY_CARE_PROVIDER_SITE_OTHER): Payer: Medicare Other | Admitting: Ophthalmology

## 2019-09-14 DIAGNOSIS — H353211 Exudative age-related macular degeneration, right eye, with active choroidal neovascularization: Secondary | ICD-10-CM | POA: Diagnosis not present

## 2019-09-14 DIAGNOSIS — H35033 Hypertensive retinopathy, bilateral: Secondary | ICD-10-CM | POA: Diagnosis not present

## 2019-09-14 DIAGNOSIS — H43813 Vitreous degeneration, bilateral: Secondary | ICD-10-CM

## 2019-09-14 DIAGNOSIS — H353122 Nonexudative age-related macular degeneration, left eye, intermediate dry stage: Secondary | ICD-10-CM

## 2019-09-14 DIAGNOSIS — I10 Essential (primary) hypertension: Secondary | ICD-10-CM | POA: Diagnosis not present

## 2019-10-14 ENCOUNTER — Encounter (INDEPENDENT_AMBULATORY_CARE_PROVIDER_SITE_OTHER): Payer: Medicare Other | Admitting: Ophthalmology

## 2019-10-14 DIAGNOSIS — H43813 Vitreous degeneration, bilateral: Secondary | ICD-10-CM

## 2019-10-14 DIAGNOSIS — H35033 Hypertensive retinopathy, bilateral: Secondary | ICD-10-CM | POA: Diagnosis not present

## 2019-10-14 DIAGNOSIS — I10 Essential (primary) hypertension: Secondary | ICD-10-CM

## 2019-10-14 DIAGNOSIS — H353211 Exudative age-related macular degeneration, right eye, with active choroidal neovascularization: Secondary | ICD-10-CM

## 2019-10-14 DIAGNOSIS — H353122 Nonexudative age-related macular degeneration, left eye, intermediate dry stage: Secondary | ICD-10-CM

## 2019-11-10 DIAGNOSIS — L570 Actinic keratosis: Secondary | ICD-10-CM | POA: Diagnosis not present

## 2019-11-10 DIAGNOSIS — L821 Other seborrheic keratosis: Secondary | ICD-10-CM | POA: Diagnosis not present

## 2019-11-10 DIAGNOSIS — C44319 Basal cell carcinoma of skin of other parts of face: Secondary | ICD-10-CM | POA: Diagnosis not present

## 2019-11-18 ENCOUNTER — Encounter (INDEPENDENT_AMBULATORY_CARE_PROVIDER_SITE_OTHER): Payer: Medicare Other | Admitting: Ophthalmology

## 2019-11-18 ENCOUNTER — Other Ambulatory Visit: Payer: Self-pay

## 2019-11-18 DIAGNOSIS — H43813 Vitreous degeneration, bilateral: Secondary | ICD-10-CM

## 2019-11-18 DIAGNOSIS — I10 Essential (primary) hypertension: Secondary | ICD-10-CM

## 2019-11-18 DIAGNOSIS — H353211 Exudative age-related macular degeneration, right eye, with active choroidal neovascularization: Secondary | ICD-10-CM

## 2019-11-18 DIAGNOSIS — H353122 Nonexudative age-related macular degeneration, left eye, intermediate dry stage: Secondary | ICD-10-CM

## 2019-11-18 DIAGNOSIS — H35033 Hypertensive retinopathy, bilateral: Secondary | ICD-10-CM

## 2019-11-20 DIAGNOSIS — Z96651 Presence of right artificial knee joint: Secondary | ICD-10-CM | POA: Diagnosis not present

## 2019-11-20 DIAGNOSIS — Z471 Aftercare following joint replacement surgery: Secondary | ICD-10-CM | POA: Diagnosis not present

## 2019-11-20 DIAGNOSIS — M1712 Unilateral primary osteoarthritis, left knee: Secondary | ICD-10-CM | POA: Diagnosis not present

## 2019-11-20 DIAGNOSIS — M25562 Pain in left knee: Secondary | ICD-10-CM | POA: Diagnosis not present

## 2019-12-16 ENCOUNTER — Encounter (INDEPENDENT_AMBULATORY_CARE_PROVIDER_SITE_OTHER): Payer: Medicare Other | Admitting: Ophthalmology

## 2019-12-16 DIAGNOSIS — H353122 Nonexudative age-related macular degeneration, left eye, intermediate dry stage: Secondary | ICD-10-CM | POA: Diagnosis not present

## 2019-12-16 DIAGNOSIS — H35033 Hypertensive retinopathy, bilateral: Secondary | ICD-10-CM | POA: Diagnosis not present

## 2019-12-16 DIAGNOSIS — I10 Essential (primary) hypertension: Secondary | ICD-10-CM

## 2019-12-16 DIAGNOSIS — H353211 Exudative age-related macular degeneration, right eye, with active choroidal neovascularization: Secondary | ICD-10-CM | POA: Diagnosis not present

## 2019-12-16 DIAGNOSIS — H43813 Vitreous degeneration, bilateral: Secondary | ICD-10-CM | POA: Diagnosis not present

## 2019-12-29 DIAGNOSIS — I10 Essential (primary) hypertension: Secondary | ICD-10-CM | POA: Diagnosis not present

## 2019-12-29 DIAGNOSIS — R42 Dizziness and giddiness: Secondary | ICD-10-CM | POA: Diagnosis not present

## 2020-01-13 ENCOUNTER — Encounter (INDEPENDENT_AMBULATORY_CARE_PROVIDER_SITE_OTHER): Payer: Medicare Other | Admitting: Ophthalmology

## 2020-01-13 DIAGNOSIS — I10 Essential (primary) hypertension: Secondary | ICD-10-CM | POA: Diagnosis not present

## 2020-01-13 DIAGNOSIS — H43813 Vitreous degeneration, bilateral: Secondary | ICD-10-CM

## 2020-01-13 DIAGNOSIS — H353211 Exudative age-related macular degeneration, right eye, with active choroidal neovascularization: Secondary | ICD-10-CM | POA: Diagnosis not present

## 2020-01-13 DIAGNOSIS — H35033 Hypertensive retinopathy, bilateral: Secondary | ICD-10-CM

## 2020-01-13 DIAGNOSIS — H353122 Nonexudative age-related macular degeneration, left eye, intermediate dry stage: Secondary | ICD-10-CM

## 2020-02-10 ENCOUNTER — Other Ambulatory Visit: Payer: Self-pay

## 2020-02-10 ENCOUNTER — Encounter (INDEPENDENT_AMBULATORY_CARE_PROVIDER_SITE_OTHER): Payer: Medicare Other | Admitting: Ophthalmology

## 2020-02-10 DIAGNOSIS — H353211 Exudative age-related macular degeneration, right eye, with active choroidal neovascularization: Secondary | ICD-10-CM | POA: Diagnosis not present

## 2020-02-10 DIAGNOSIS — I10 Essential (primary) hypertension: Secondary | ICD-10-CM | POA: Diagnosis not present

## 2020-02-10 DIAGNOSIS — H353122 Nonexudative age-related macular degeneration, left eye, intermediate dry stage: Secondary | ICD-10-CM

## 2020-02-10 DIAGNOSIS — H35033 Hypertensive retinopathy, bilateral: Secondary | ICD-10-CM

## 2020-02-10 DIAGNOSIS — H43813 Vitreous degeneration, bilateral: Secondary | ICD-10-CM

## 2020-02-29 DIAGNOSIS — Z01818 Encounter for other preprocedural examination: Secondary | ICD-10-CM | POA: Diagnosis not present

## 2020-02-29 DIAGNOSIS — S81812A Laceration without foreign body, left lower leg, initial encounter: Secondary | ICD-10-CM | POA: Diagnosis not present

## 2020-02-29 DIAGNOSIS — R809 Proteinuria, unspecified: Secondary | ICD-10-CM | POA: Diagnosis not present

## 2020-02-29 DIAGNOSIS — E1129 Type 2 diabetes mellitus with other diabetic kidney complication: Secondary | ICD-10-CM | POA: Diagnosis not present

## 2020-03-02 DIAGNOSIS — Z79899 Other long term (current) drug therapy: Secondary | ICD-10-CM | POA: Diagnosis not present

## 2020-03-02 DIAGNOSIS — E559 Vitamin D deficiency, unspecified: Secondary | ICD-10-CM | POA: Diagnosis not present

## 2020-03-02 DIAGNOSIS — M79609 Pain in unspecified limb: Secondary | ICD-10-CM | POA: Diagnosis not present

## 2020-03-02 DIAGNOSIS — J9811 Atelectasis: Secondary | ICD-10-CM | POA: Diagnosis not present

## 2020-03-02 DIAGNOSIS — Z01818 Encounter for other preprocedural examination: Secondary | ICD-10-CM | POA: Diagnosis not present

## 2020-03-02 DIAGNOSIS — R911 Solitary pulmonary nodule: Secondary | ICD-10-CM | POA: Diagnosis not present

## 2020-03-09 ENCOUNTER — Other Ambulatory Visit: Payer: Self-pay

## 2020-03-09 ENCOUNTER — Encounter (INDEPENDENT_AMBULATORY_CARE_PROVIDER_SITE_OTHER): Payer: Medicare Other | Admitting: Ophthalmology

## 2020-03-09 DIAGNOSIS — H353122 Nonexudative age-related macular degeneration, left eye, intermediate dry stage: Secondary | ICD-10-CM | POA: Diagnosis not present

## 2020-03-09 DIAGNOSIS — H353211 Exudative age-related macular degeneration, right eye, with active choroidal neovascularization: Secondary | ICD-10-CM

## 2020-03-09 DIAGNOSIS — H43813 Vitreous degeneration, bilateral: Secondary | ICD-10-CM

## 2020-03-09 DIAGNOSIS — I10 Essential (primary) hypertension: Secondary | ICD-10-CM

## 2020-03-09 DIAGNOSIS — H35033 Hypertensive retinopathy, bilateral: Secondary | ICD-10-CM | POA: Diagnosis not present

## 2020-03-09 DIAGNOSIS — Z1159 Encounter for screening for other viral diseases: Secondary | ICD-10-CM | POA: Diagnosis not present

## 2020-03-09 DIAGNOSIS — Z1152 Encounter for screening for COVID-19: Secondary | ICD-10-CM | POA: Diagnosis not present

## 2020-03-15 DIAGNOSIS — I10 Essential (primary) hypertension: Secondary | ICD-10-CM | POA: Diagnosis not present

## 2020-03-15 DIAGNOSIS — Z471 Aftercare following joint replacement surgery: Secondary | ICD-10-CM | POA: Diagnosis not present

## 2020-03-15 DIAGNOSIS — M1712 Unilateral primary osteoarthritis, left knee: Secondary | ICD-10-CM | POA: Diagnosis not present

## 2020-03-15 DIAGNOSIS — Z79899 Other long term (current) drug therapy: Secondary | ICD-10-CM | POA: Diagnosis not present

## 2020-03-15 DIAGNOSIS — Z882 Allergy status to sulfonamides status: Secondary | ICD-10-CM | POA: Diagnosis not present

## 2020-03-15 DIAGNOSIS — Z96652 Presence of left artificial knee joint: Secondary | ICD-10-CM | POA: Diagnosis not present

## 2020-03-15 DIAGNOSIS — G8918 Other acute postprocedural pain: Secondary | ICD-10-CM | POA: Diagnosis not present

## 2020-03-15 DIAGNOSIS — M25562 Pain in left knee: Secondary | ICD-10-CM | POA: Diagnosis not present

## 2020-03-17 DIAGNOSIS — E1129 Type 2 diabetes mellitus with other diabetic kidney complication: Secondary | ICD-10-CM | POA: Diagnosis not present

## 2020-03-17 DIAGNOSIS — I341 Nonrheumatic mitral (valve) prolapse: Secondary | ICD-10-CM | POA: Diagnosis not present

## 2020-03-17 DIAGNOSIS — I10 Essential (primary) hypertension: Secondary | ICD-10-CM | POA: Diagnosis not present

## 2020-03-17 DIAGNOSIS — E7800 Pure hypercholesterolemia, unspecified: Secondary | ICD-10-CM | POA: Diagnosis not present

## 2020-03-17 DIAGNOSIS — Z96652 Presence of left artificial knee joint: Secondary | ICD-10-CM | POA: Diagnosis not present

## 2020-03-17 DIAGNOSIS — Z7982 Long term (current) use of aspirin: Secondary | ICD-10-CM | POA: Diagnosis not present

## 2020-03-17 DIAGNOSIS — Z471 Aftercare following joint replacement surgery: Secondary | ICD-10-CM | POA: Diagnosis not present

## 2020-03-17 DIAGNOSIS — N401 Enlarged prostate with lower urinary tract symptoms: Secondary | ICD-10-CM | POA: Diagnosis not present

## 2020-03-17 DIAGNOSIS — H353211 Exudative age-related macular degeneration, right eye, with active choroidal neovascularization: Secondary | ICD-10-CM | POA: Diagnosis not present

## 2020-03-17 DIAGNOSIS — Z96651 Presence of right artificial knee joint: Secondary | ICD-10-CM | POA: Diagnosis not present

## 2020-03-18 DIAGNOSIS — I10 Essential (primary) hypertension: Secondary | ICD-10-CM | POA: Diagnosis not present

## 2020-03-18 DIAGNOSIS — H353211 Exudative age-related macular degeneration, right eye, with active choroidal neovascularization: Secondary | ICD-10-CM | POA: Diagnosis not present

## 2020-03-18 DIAGNOSIS — E7800 Pure hypercholesterolemia, unspecified: Secondary | ICD-10-CM | POA: Diagnosis not present

## 2020-03-18 DIAGNOSIS — Z471 Aftercare following joint replacement surgery: Secondary | ICD-10-CM | POA: Diagnosis not present

## 2020-03-18 DIAGNOSIS — Z96652 Presence of left artificial knee joint: Secondary | ICD-10-CM | POA: Diagnosis not present

## 2020-03-18 DIAGNOSIS — I341 Nonrheumatic mitral (valve) prolapse: Secondary | ICD-10-CM | POA: Diagnosis not present

## 2020-03-21 DIAGNOSIS — Z471 Aftercare following joint replacement surgery: Secondary | ICD-10-CM | POA: Diagnosis not present

## 2020-03-21 DIAGNOSIS — E7800 Pure hypercholesterolemia, unspecified: Secondary | ICD-10-CM | POA: Diagnosis not present

## 2020-03-21 DIAGNOSIS — I10 Essential (primary) hypertension: Secondary | ICD-10-CM | POA: Diagnosis not present

## 2020-03-21 DIAGNOSIS — I341 Nonrheumatic mitral (valve) prolapse: Secondary | ICD-10-CM | POA: Diagnosis not present

## 2020-03-21 DIAGNOSIS — H353211 Exudative age-related macular degeneration, right eye, with active choroidal neovascularization: Secondary | ICD-10-CM | POA: Diagnosis not present

## 2020-03-21 DIAGNOSIS — Z96652 Presence of left artificial knee joint: Secondary | ICD-10-CM | POA: Diagnosis not present

## 2020-03-23 DIAGNOSIS — Z96652 Presence of left artificial knee joint: Secondary | ICD-10-CM | POA: Diagnosis not present

## 2020-03-23 DIAGNOSIS — Z471 Aftercare following joint replacement surgery: Secondary | ICD-10-CM | POA: Diagnosis not present

## 2020-03-23 DIAGNOSIS — H353211 Exudative age-related macular degeneration, right eye, with active choroidal neovascularization: Secondary | ICD-10-CM | POA: Diagnosis not present

## 2020-03-23 DIAGNOSIS — E7800 Pure hypercholesterolemia, unspecified: Secondary | ICD-10-CM | POA: Diagnosis not present

## 2020-03-23 DIAGNOSIS — I10 Essential (primary) hypertension: Secondary | ICD-10-CM | POA: Diagnosis not present

## 2020-03-23 DIAGNOSIS — I341 Nonrheumatic mitral (valve) prolapse: Secondary | ICD-10-CM | POA: Diagnosis not present

## 2020-03-25 DIAGNOSIS — I10 Essential (primary) hypertension: Secondary | ICD-10-CM | POA: Diagnosis not present

## 2020-03-25 DIAGNOSIS — H353211 Exudative age-related macular degeneration, right eye, with active choroidal neovascularization: Secondary | ICD-10-CM | POA: Diagnosis not present

## 2020-03-25 DIAGNOSIS — Z471 Aftercare following joint replacement surgery: Secondary | ICD-10-CM | POA: Diagnosis not present

## 2020-03-25 DIAGNOSIS — I341 Nonrheumatic mitral (valve) prolapse: Secondary | ICD-10-CM | POA: Diagnosis not present

## 2020-03-25 DIAGNOSIS — Z96652 Presence of left artificial knee joint: Secondary | ICD-10-CM | POA: Diagnosis not present

## 2020-03-25 DIAGNOSIS — E7800 Pure hypercholesterolemia, unspecified: Secondary | ICD-10-CM | POA: Diagnosis not present

## 2020-03-27 DIAGNOSIS — Z888 Allergy status to other drugs, medicaments and biological substances status: Secondary | ICD-10-CM | POA: Diagnosis not present

## 2020-03-27 DIAGNOSIS — Z96652 Presence of left artificial knee joint: Secondary | ICD-10-CM | POA: Diagnosis present

## 2020-03-27 DIAGNOSIS — K921 Melena: Secondary | ICD-10-CM | POA: Diagnosis not present

## 2020-03-27 DIAGNOSIS — K222 Esophageal obstruction: Secondary | ICD-10-CM | POA: Diagnosis present

## 2020-03-27 DIAGNOSIS — Z79899 Other long term (current) drug therapy: Secondary | ICD-10-CM | POA: Diagnosis not present

## 2020-03-27 DIAGNOSIS — Z7982 Long term (current) use of aspirin: Secondary | ICD-10-CM | POA: Diagnosis not present

## 2020-03-27 DIAGNOSIS — R531 Weakness: Secondary | ICD-10-CM | POA: Diagnosis not present

## 2020-03-27 DIAGNOSIS — D62 Acute posthemorrhagic anemia: Secondary | ICD-10-CM | POA: Diagnosis present

## 2020-03-27 DIAGNOSIS — Z881 Allergy status to other antibiotic agents status: Secondary | ICD-10-CM | POA: Diagnosis not present

## 2020-03-27 DIAGNOSIS — E860 Dehydration: Secondary | ICD-10-CM | POA: Diagnosis present

## 2020-03-27 DIAGNOSIS — K259 Gastric ulcer, unspecified as acute or chronic, without hemorrhage or perforation: Secondary | ICD-10-CM | POA: Diagnosis not present

## 2020-03-27 DIAGNOSIS — E611 Iron deficiency: Secondary | ICD-10-CM | POA: Diagnosis not present

## 2020-03-27 DIAGNOSIS — Z79891 Long term (current) use of opiate analgesic: Secondary | ICD-10-CM | POA: Diagnosis not present

## 2020-03-27 DIAGNOSIS — K922 Gastrointestinal hemorrhage, unspecified: Secondary | ICD-10-CM | POA: Diagnosis present

## 2020-03-27 DIAGNOSIS — J9811 Atelectasis: Secondary | ICD-10-CM | POA: Diagnosis not present

## 2020-03-27 DIAGNOSIS — Z882 Allergy status to sulfonamides status: Secondary | ICD-10-CM | POA: Diagnosis not present

## 2020-03-27 DIAGNOSIS — M25562 Pain in left knee: Secondary | ICD-10-CM | POA: Diagnosis not present

## 2020-03-27 DIAGNOSIS — D500 Iron deficiency anemia secondary to blood loss (chronic): Secondary | ICD-10-CM | POA: Diagnosis not present

## 2020-03-27 DIAGNOSIS — D649 Anemia, unspecified: Secondary | ICD-10-CM | POA: Diagnosis not present

## 2020-03-27 DIAGNOSIS — M1990 Unspecified osteoarthritis, unspecified site: Secondary | ICD-10-CM | POA: Diagnosis present

## 2020-03-27 DIAGNOSIS — Z87442 Personal history of urinary calculi: Secondary | ICD-10-CM | POA: Diagnosis not present

## 2020-03-27 DIAGNOSIS — K449 Diaphragmatic hernia without obstruction or gangrene: Secondary | ICD-10-CM | POA: Diagnosis present

## 2020-03-27 DIAGNOSIS — I10 Essential (primary) hypertension: Secondary | ICD-10-CM | POA: Diagnosis present

## 2020-03-28 DIAGNOSIS — E611 Iron deficiency: Secondary | ICD-10-CM | POA: Diagnosis not present

## 2020-03-28 DIAGNOSIS — D500 Iron deficiency anemia secondary to blood loss (chronic): Secondary | ICD-10-CM | POA: Diagnosis not present

## 2020-03-28 DIAGNOSIS — Z7982 Long term (current) use of aspirin: Secondary | ICD-10-CM | POA: Diagnosis not present

## 2020-03-28 DIAGNOSIS — Z96652 Presence of left artificial knee joint: Secondary | ICD-10-CM | POA: Diagnosis not present

## 2020-03-28 DIAGNOSIS — K222 Esophageal obstruction: Secondary | ICD-10-CM | POA: Diagnosis not present

## 2020-03-28 DIAGNOSIS — K922 Gastrointestinal hemorrhage, unspecified: Secondary | ICD-10-CM | POA: Diagnosis not present

## 2020-03-28 DIAGNOSIS — I10 Essential (primary) hypertension: Secondary | ICD-10-CM | POA: Diagnosis not present

## 2020-03-28 DIAGNOSIS — K921 Melena: Secondary | ICD-10-CM | POA: Diagnosis not present

## 2020-03-28 DIAGNOSIS — K259 Gastric ulcer, unspecified as acute or chronic, without hemorrhage or perforation: Secondary | ICD-10-CM | POA: Diagnosis not present

## 2020-03-30 DIAGNOSIS — I10 Essential (primary) hypertension: Secondary | ICD-10-CM | POA: Diagnosis not present

## 2020-03-30 DIAGNOSIS — Z96652 Presence of left artificial knee joint: Secondary | ICD-10-CM | POA: Diagnosis not present

## 2020-03-30 DIAGNOSIS — Z471 Aftercare following joint replacement surgery: Secondary | ICD-10-CM | POA: Diagnosis not present

## 2020-03-30 DIAGNOSIS — E7800 Pure hypercholesterolemia, unspecified: Secondary | ICD-10-CM | POA: Diagnosis not present

## 2020-03-30 DIAGNOSIS — H353211 Exudative age-related macular degeneration, right eye, with active choroidal neovascularization: Secondary | ICD-10-CM | POA: Diagnosis not present

## 2020-03-30 DIAGNOSIS — I341 Nonrheumatic mitral (valve) prolapse: Secondary | ICD-10-CM | POA: Diagnosis not present

## 2020-03-31 DIAGNOSIS — N401 Enlarged prostate with lower urinary tract symptoms: Secondary | ICD-10-CM | POA: Diagnosis not present

## 2020-03-31 DIAGNOSIS — N3289 Other specified disorders of bladder: Secondary | ICD-10-CM | POA: Diagnosis not present

## 2020-04-01 DIAGNOSIS — H353211 Exudative age-related macular degeneration, right eye, with active choroidal neovascularization: Secondary | ICD-10-CM | POA: Diagnosis not present

## 2020-04-01 DIAGNOSIS — I10 Essential (primary) hypertension: Secondary | ICD-10-CM | POA: Diagnosis not present

## 2020-04-01 DIAGNOSIS — I341 Nonrheumatic mitral (valve) prolapse: Secondary | ICD-10-CM | POA: Diagnosis not present

## 2020-04-01 DIAGNOSIS — Z471 Aftercare following joint replacement surgery: Secondary | ICD-10-CM | POA: Diagnosis not present

## 2020-04-01 DIAGNOSIS — E7800 Pure hypercholesterolemia, unspecified: Secondary | ICD-10-CM | POA: Diagnosis not present

## 2020-04-01 DIAGNOSIS — Z96652 Presence of left artificial knee joint: Secondary | ICD-10-CM | POA: Diagnosis not present

## 2020-04-05 ENCOUNTER — Encounter (INDEPENDENT_AMBULATORY_CARE_PROVIDER_SITE_OTHER): Payer: Medicare Other | Admitting: Ophthalmology

## 2020-04-05 DIAGNOSIS — Z6822 Body mass index (BMI) 22.0-22.9, adult: Secondary | ICD-10-CM | POA: Diagnosis not present

## 2020-04-05 DIAGNOSIS — K922 Gastrointestinal hemorrhage, unspecified: Secondary | ICD-10-CM | POA: Diagnosis not present

## 2020-04-05 DIAGNOSIS — Z7689 Persons encountering health services in other specified circumstances: Secondary | ICD-10-CM | POA: Diagnosis not present

## 2020-04-12 ENCOUNTER — Encounter (INDEPENDENT_AMBULATORY_CARE_PROVIDER_SITE_OTHER): Payer: Medicare Other | Admitting: Ophthalmology

## 2020-04-13 DIAGNOSIS — M6281 Muscle weakness (generalized): Secondary | ICD-10-CM | POA: Diagnosis not present

## 2020-04-13 DIAGNOSIS — M25662 Stiffness of left knee, not elsewhere classified: Secondary | ICD-10-CM | POA: Diagnosis not present

## 2020-04-13 DIAGNOSIS — M25562 Pain in left knee: Secondary | ICD-10-CM | POA: Diagnosis not present

## 2020-04-15 DIAGNOSIS — M25562 Pain in left knee: Secondary | ICD-10-CM | POA: Diagnosis not present

## 2020-04-15 DIAGNOSIS — M6281 Muscle weakness (generalized): Secondary | ICD-10-CM | POA: Diagnosis not present

## 2020-04-15 DIAGNOSIS — M25662 Stiffness of left knee, not elsewhere classified: Secondary | ICD-10-CM | POA: Diagnosis not present

## 2020-04-19 ENCOUNTER — Other Ambulatory Visit: Payer: Self-pay

## 2020-04-19 ENCOUNTER — Encounter (INDEPENDENT_AMBULATORY_CARE_PROVIDER_SITE_OTHER): Payer: Medicare Other | Admitting: Ophthalmology

## 2020-04-19 DIAGNOSIS — H353122 Nonexudative age-related macular degeneration, left eye, intermediate dry stage: Secondary | ICD-10-CM | POA: Diagnosis not present

## 2020-04-19 DIAGNOSIS — H43813 Vitreous degeneration, bilateral: Secondary | ICD-10-CM | POA: Diagnosis not present

## 2020-04-19 DIAGNOSIS — I10 Essential (primary) hypertension: Secondary | ICD-10-CM

## 2020-04-19 DIAGNOSIS — H353211 Exudative age-related macular degeneration, right eye, with active choroidal neovascularization: Secondary | ICD-10-CM | POA: Diagnosis not present

## 2020-04-19 DIAGNOSIS — H35033 Hypertensive retinopathy, bilateral: Secondary | ICD-10-CM | POA: Diagnosis not present

## 2020-04-20 DIAGNOSIS — M6281 Muscle weakness (generalized): Secondary | ICD-10-CM | POA: Diagnosis not present

## 2020-04-20 DIAGNOSIS — M25562 Pain in left knee: Secondary | ICD-10-CM | POA: Diagnosis not present

## 2020-04-20 DIAGNOSIS — M25662 Stiffness of left knee, not elsewhere classified: Secondary | ICD-10-CM | POA: Diagnosis not present

## 2020-04-22 DIAGNOSIS — M6281 Muscle weakness (generalized): Secondary | ICD-10-CM | POA: Diagnosis not present

## 2020-04-22 DIAGNOSIS — M25562 Pain in left knee: Secondary | ICD-10-CM | POA: Diagnosis not present

## 2020-04-22 DIAGNOSIS — M25662 Stiffness of left knee, not elsewhere classified: Secondary | ICD-10-CM | POA: Diagnosis not present

## 2020-04-27 DIAGNOSIS — M25562 Pain in left knee: Secondary | ICD-10-CM | POA: Diagnosis not present

## 2020-04-27 DIAGNOSIS — M1712 Unilateral primary osteoarthritis, left knee: Secondary | ICD-10-CM | POA: Diagnosis not present

## 2020-04-27 DIAGNOSIS — M25662 Stiffness of left knee, not elsewhere classified: Secondary | ICD-10-CM | POA: Diagnosis not present

## 2020-04-27 DIAGNOSIS — M6281 Muscle weakness (generalized): Secondary | ICD-10-CM | POA: Diagnosis not present

## 2020-04-29 DIAGNOSIS — M25662 Stiffness of left knee, not elsewhere classified: Secondary | ICD-10-CM | POA: Diagnosis not present

## 2020-04-29 DIAGNOSIS — M25562 Pain in left knee: Secondary | ICD-10-CM | POA: Diagnosis not present

## 2020-04-29 DIAGNOSIS — M6281 Muscle weakness (generalized): Secondary | ICD-10-CM | POA: Diagnosis not present

## 2020-05-02 DIAGNOSIS — M25562 Pain in left knee: Secondary | ICD-10-CM | POA: Diagnosis not present

## 2020-05-02 DIAGNOSIS — M25662 Stiffness of left knee, not elsewhere classified: Secondary | ICD-10-CM | POA: Diagnosis not present

## 2020-05-02 DIAGNOSIS — D649 Anemia, unspecified: Secondary | ICD-10-CM | POA: Diagnosis not present

## 2020-05-02 DIAGNOSIS — M6281 Muscle weakness (generalized): Secondary | ICD-10-CM | POA: Diagnosis not present

## 2020-05-04 DIAGNOSIS — N3289 Other specified disorders of bladder: Secondary | ICD-10-CM | POA: Diagnosis not present

## 2020-05-04 DIAGNOSIS — Z87442 Personal history of urinary calculi: Secondary | ICD-10-CM | POA: Diagnosis not present

## 2020-05-04 DIAGNOSIS — N401 Enlarged prostate with lower urinary tract symptoms: Secondary | ICD-10-CM | POA: Diagnosis not present

## 2020-05-05 DIAGNOSIS — M25562 Pain in left knee: Secondary | ICD-10-CM | POA: Diagnosis not present

## 2020-05-05 DIAGNOSIS — M25662 Stiffness of left knee, not elsewhere classified: Secondary | ICD-10-CM | POA: Diagnosis not present

## 2020-05-05 DIAGNOSIS — M6281 Muscle weakness (generalized): Secondary | ICD-10-CM | POA: Diagnosis not present

## 2020-05-09 DIAGNOSIS — M25662 Stiffness of left knee, not elsewhere classified: Secondary | ICD-10-CM | POA: Diagnosis not present

## 2020-05-09 DIAGNOSIS — M25562 Pain in left knee: Secondary | ICD-10-CM | POA: Diagnosis not present

## 2020-05-09 DIAGNOSIS — M6281 Muscle weakness (generalized): Secondary | ICD-10-CM | POA: Diagnosis not present

## 2020-05-11 DIAGNOSIS — Z1331 Encounter for screening for depression: Secondary | ICD-10-CM | POA: Diagnosis not present

## 2020-05-11 DIAGNOSIS — Z23 Encounter for immunization: Secondary | ICD-10-CM | POA: Diagnosis not present

## 2020-05-11 DIAGNOSIS — Z0000 Encounter for general adult medical examination without abnormal findings: Secondary | ICD-10-CM | POA: Diagnosis not present

## 2020-05-11 DIAGNOSIS — L03116 Cellulitis of left lower limb: Secondary | ICD-10-CM | POA: Diagnosis not present

## 2020-05-11 DIAGNOSIS — R809 Proteinuria, unspecified: Secondary | ICD-10-CM | POA: Diagnosis not present

## 2020-05-11 DIAGNOSIS — Z9181 History of falling: Secondary | ICD-10-CM | POA: Diagnosis not present

## 2020-05-11 DIAGNOSIS — E1129 Type 2 diabetes mellitus with other diabetic kidney complication: Secondary | ICD-10-CM | POA: Diagnosis not present

## 2020-05-12 DIAGNOSIS — D509 Iron deficiency anemia, unspecified: Secondary | ICD-10-CM | POA: Diagnosis not present

## 2020-05-12 DIAGNOSIS — K259 Gastric ulcer, unspecified as acute or chronic, without hemorrhage or perforation: Secondary | ICD-10-CM | POA: Diagnosis not present

## 2020-05-13 DIAGNOSIS — M25562 Pain in left knee: Secondary | ICD-10-CM | POA: Diagnosis not present

## 2020-05-13 DIAGNOSIS — M6281 Muscle weakness (generalized): Secondary | ICD-10-CM | POA: Diagnosis not present

## 2020-05-13 DIAGNOSIS — M25662 Stiffness of left knee, not elsewhere classified: Secondary | ICD-10-CM | POA: Diagnosis not present

## 2020-05-16 DIAGNOSIS — M25662 Stiffness of left knee, not elsewhere classified: Secondary | ICD-10-CM | POA: Diagnosis not present

## 2020-05-16 DIAGNOSIS — M25562 Pain in left knee: Secondary | ICD-10-CM | POA: Diagnosis not present

## 2020-05-16 DIAGNOSIS — M6281 Muscle weakness (generalized): Secondary | ICD-10-CM | POA: Diagnosis not present

## 2020-05-17 ENCOUNTER — Other Ambulatory Visit: Payer: Self-pay

## 2020-05-17 ENCOUNTER — Encounter (INDEPENDENT_AMBULATORY_CARE_PROVIDER_SITE_OTHER): Payer: Medicare Other | Admitting: Ophthalmology

## 2020-05-17 DIAGNOSIS — I10 Essential (primary) hypertension: Secondary | ICD-10-CM | POA: Diagnosis not present

## 2020-05-17 DIAGNOSIS — H353122 Nonexudative age-related macular degeneration, left eye, intermediate dry stage: Secondary | ICD-10-CM

## 2020-05-17 DIAGNOSIS — H35033 Hypertensive retinopathy, bilateral: Secondary | ICD-10-CM

## 2020-05-17 DIAGNOSIS — H353211 Exudative age-related macular degeneration, right eye, with active choroidal neovascularization: Secondary | ICD-10-CM | POA: Diagnosis not present

## 2020-05-17 DIAGNOSIS — H43813 Vitreous degeneration, bilateral: Secondary | ICD-10-CM | POA: Diagnosis not present

## 2020-05-20 DIAGNOSIS — M6281 Muscle weakness (generalized): Secondary | ICD-10-CM | POA: Diagnosis not present

## 2020-05-20 DIAGNOSIS — M25562 Pain in left knee: Secondary | ICD-10-CM | POA: Diagnosis not present

## 2020-05-20 DIAGNOSIS — M25662 Stiffness of left knee, not elsewhere classified: Secondary | ICD-10-CM | POA: Diagnosis not present

## 2020-05-30 DIAGNOSIS — M6281 Muscle weakness (generalized): Secondary | ICD-10-CM | POA: Diagnosis not present

## 2020-05-30 DIAGNOSIS — M25562 Pain in left knee: Secondary | ICD-10-CM | POA: Diagnosis not present

## 2020-05-30 DIAGNOSIS — M25662 Stiffness of left knee, not elsewhere classified: Secondary | ICD-10-CM | POA: Diagnosis not present

## 2020-06-10 DIAGNOSIS — L814 Other melanin hyperpigmentation: Secondary | ICD-10-CM | POA: Diagnosis not present

## 2020-06-10 DIAGNOSIS — L570 Actinic keratosis: Secondary | ICD-10-CM | POA: Diagnosis not present

## 2020-06-10 DIAGNOSIS — L821 Other seborrheic keratosis: Secondary | ICD-10-CM | POA: Diagnosis not present

## 2020-06-15 ENCOUNTER — Encounter (INDEPENDENT_AMBULATORY_CARE_PROVIDER_SITE_OTHER): Payer: Medicare Other | Admitting: Ophthalmology

## 2020-06-15 ENCOUNTER — Other Ambulatory Visit: Payer: Self-pay

## 2020-06-15 DIAGNOSIS — H353122 Nonexudative age-related macular degeneration, left eye, intermediate dry stage: Secondary | ICD-10-CM | POA: Diagnosis not present

## 2020-06-15 DIAGNOSIS — H35033 Hypertensive retinopathy, bilateral: Secondary | ICD-10-CM | POA: Diagnosis not present

## 2020-06-15 DIAGNOSIS — H353211 Exudative age-related macular degeneration, right eye, with active choroidal neovascularization: Secondary | ICD-10-CM | POA: Diagnosis not present

## 2020-06-15 DIAGNOSIS — I10 Essential (primary) hypertension: Secondary | ICD-10-CM

## 2020-06-15 DIAGNOSIS — H43813 Vitreous degeneration, bilateral: Secondary | ICD-10-CM | POA: Diagnosis not present

## 2020-06-23 DIAGNOSIS — R0981 Nasal congestion: Secondary | ICD-10-CM | POA: Diagnosis not present

## 2020-06-23 DIAGNOSIS — Z20828 Contact with and (suspected) exposure to other viral communicable diseases: Secondary | ICD-10-CM | POA: Diagnosis not present

## 2020-06-23 DIAGNOSIS — J069 Acute upper respiratory infection, unspecified: Secondary | ICD-10-CM | POA: Diagnosis not present

## 2020-06-23 DIAGNOSIS — J0100 Acute maxillary sinusitis, unspecified: Secondary | ICD-10-CM | POA: Diagnosis not present

## 2020-07-04 DIAGNOSIS — Z23 Encounter for immunization: Secondary | ICD-10-CM | POA: Diagnosis not present

## 2020-07-13 ENCOUNTER — Encounter (INDEPENDENT_AMBULATORY_CARE_PROVIDER_SITE_OTHER): Payer: Medicare Other | Admitting: Ophthalmology

## 2020-07-13 ENCOUNTER — Other Ambulatory Visit: Payer: Self-pay

## 2020-07-13 DIAGNOSIS — H43813 Vitreous degeneration, bilateral: Secondary | ICD-10-CM | POA: Diagnosis not present

## 2020-07-13 DIAGNOSIS — H35033 Hypertensive retinopathy, bilateral: Secondary | ICD-10-CM

## 2020-07-13 DIAGNOSIS — I10 Essential (primary) hypertension: Secondary | ICD-10-CM | POA: Diagnosis not present

## 2020-07-13 DIAGNOSIS — H353211 Exudative age-related macular degeneration, right eye, with active choroidal neovascularization: Secondary | ICD-10-CM

## 2020-07-13 DIAGNOSIS — H353122 Nonexudative age-related macular degeneration, left eye, intermediate dry stage: Secondary | ICD-10-CM | POA: Diagnosis not present

## 2020-08-17 ENCOUNTER — Other Ambulatory Visit: Payer: Self-pay

## 2020-08-17 ENCOUNTER — Encounter (INDEPENDENT_AMBULATORY_CARE_PROVIDER_SITE_OTHER): Payer: Medicare Other | Admitting: Ophthalmology

## 2020-08-17 DIAGNOSIS — I10 Essential (primary) hypertension: Secondary | ICD-10-CM | POA: Diagnosis not present

## 2020-08-17 DIAGNOSIS — H35033 Hypertensive retinopathy, bilateral: Secondary | ICD-10-CM | POA: Diagnosis not present

## 2020-08-17 DIAGNOSIS — H353122 Nonexudative age-related macular degeneration, left eye, intermediate dry stage: Secondary | ICD-10-CM

## 2020-08-17 DIAGNOSIS — H43813 Vitreous degeneration, bilateral: Secondary | ICD-10-CM

## 2020-08-17 DIAGNOSIS — H353211 Exudative age-related macular degeneration, right eye, with active choroidal neovascularization: Secondary | ICD-10-CM | POA: Diagnosis not present

## 2020-09-08 DIAGNOSIS — M1712 Unilateral primary osteoarthritis, left knee: Secondary | ICD-10-CM | POA: Diagnosis not present

## 2020-09-14 ENCOUNTER — Encounter (INDEPENDENT_AMBULATORY_CARE_PROVIDER_SITE_OTHER): Payer: Medicare Other | Admitting: Ophthalmology

## 2020-09-14 ENCOUNTER — Other Ambulatory Visit: Payer: Self-pay

## 2020-09-14 DIAGNOSIS — H35033 Hypertensive retinopathy, bilateral: Secondary | ICD-10-CM

## 2020-09-14 DIAGNOSIS — H353122 Nonexudative age-related macular degeneration, left eye, intermediate dry stage: Secondary | ICD-10-CM | POA: Diagnosis not present

## 2020-09-14 DIAGNOSIS — I10 Essential (primary) hypertension: Secondary | ICD-10-CM

## 2020-09-14 DIAGNOSIS — H353211 Exudative age-related macular degeneration, right eye, with active choroidal neovascularization: Secondary | ICD-10-CM | POA: Diagnosis not present

## 2020-09-14 DIAGNOSIS — H43813 Vitreous degeneration, bilateral: Secondary | ICD-10-CM

## 2020-10-12 ENCOUNTER — Other Ambulatory Visit: Payer: Self-pay

## 2020-10-12 ENCOUNTER — Encounter (INDEPENDENT_AMBULATORY_CARE_PROVIDER_SITE_OTHER): Payer: Medicare Other | Admitting: Ophthalmology

## 2020-10-12 DIAGNOSIS — H353211 Exudative age-related macular degeneration, right eye, with active choroidal neovascularization: Secondary | ICD-10-CM | POA: Diagnosis not present

## 2020-10-12 DIAGNOSIS — H353122 Nonexudative age-related macular degeneration, left eye, intermediate dry stage: Secondary | ICD-10-CM | POA: Diagnosis not present

## 2020-10-12 DIAGNOSIS — H43813 Vitreous degeneration, bilateral: Secondary | ICD-10-CM

## 2020-11-01 DIAGNOSIS — N401 Enlarged prostate with lower urinary tract symptoms: Secondary | ICD-10-CM | POA: Diagnosis not present

## 2020-11-01 DIAGNOSIS — N3289 Other specified disorders of bladder: Secondary | ICD-10-CM | POA: Diagnosis not present

## 2020-11-09 ENCOUNTER — Encounter (INDEPENDENT_AMBULATORY_CARE_PROVIDER_SITE_OTHER): Payer: Medicare Other | Admitting: Ophthalmology

## 2020-11-09 ENCOUNTER — Other Ambulatory Visit: Payer: Self-pay

## 2020-11-09 DIAGNOSIS — I10 Essential (primary) hypertension: Secondary | ICD-10-CM | POA: Diagnosis not present

## 2020-11-09 DIAGNOSIS — H353122 Nonexudative age-related macular degeneration, left eye, intermediate dry stage: Secondary | ICD-10-CM

## 2020-11-09 DIAGNOSIS — H43813 Vitreous degeneration, bilateral: Secondary | ICD-10-CM | POA: Diagnosis not present

## 2020-11-09 DIAGNOSIS — H35033 Hypertensive retinopathy, bilateral: Secondary | ICD-10-CM

## 2020-11-09 DIAGNOSIS — H353211 Exudative age-related macular degeneration, right eye, with active choroidal neovascularization: Secondary | ICD-10-CM | POA: Diagnosis not present

## 2020-12-07 ENCOUNTER — Other Ambulatory Visit: Payer: Self-pay

## 2020-12-07 ENCOUNTER — Encounter (INDEPENDENT_AMBULATORY_CARE_PROVIDER_SITE_OTHER): Payer: Medicare Other | Admitting: Ophthalmology

## 2020-12-07 DIAGNOSIS — H353122 Nonexudative age-related macular degeneration, left eye, intermediate dry stage: Secondary | ICD-10-CM

## 2020-12-07 DIAGNOSIS — H353211 Exudative age-related macular degeneration, right eye, with active choroidal neovascularization: Secondary | ICD-10-CM

## 2020-12-07 DIAGNOSIS — H43813 Vitreous degeneration, bilateral: Secondary | ICD-10-CM | POA: Diagnosis not present

## 2020-12-07 DIAGNOSIS — H35033 Hypertensive retinopathy, bilateral: Secondary | ICD-10-CM

## 2020-12-07 DIAGNOSIS — I10 Essential (primary) hypertension: Secondary | ICD-10-CM | POA: Diagnosis not present

## 2021-01-04 ENCOUNTER — Other Ambulatory Visit: Payer: Self-pay

## 2021-01-04 ENCOUNTER — Encounter (INDEPENDENT_AMBULATORY_CARE_PROVIDER_SITE_OTHER): Payer: Medicare Other | Admitting: Ophthalmology

## 2021-01-04 DIAGNOSIS — H353122 Nonexudative age-related macular degeneration, left eye, intermediate dry stage: Secondary | ICD-10-CM | POA: Diagnosis not present

## 2021-01-04 DIAGNOSIS — H43813 Vitreous degeneration, bilateral: Secondary | ICD-10-CM

## 2021-01-04 DIAGNOSIS — H353211 Exudative age-related macular degeneration, right eye, with active choroidal neovascularization: Secondary | ICD-10-CM | POA: Diagnosis not present

## 2021-01-04 DIAGNOSIS — H35033 Hypertensive retinopathy, bilateral: Secondary | ICD-10-CM | POA: Diagnosis not present

## 2021-01-04 DIAGNOSIS — I10 Essential (primary) hypertension: Secondary | ICD-10-CM

## 2021-02-01 ENCOUNTER — Encounter (INDEPENDENT_AMBULATORY_CARE_PROVIDER_SITE_OTHER): Payer: Medicare Other | Admitting: Ophthalmology

## 2021-02-01 ENCOUNTER — Other Ambulatory Visit: Payer: Self-pay

## 2021-02-01 DIAGNOSIS — H353211 Exudative age-related macular degeneration, right eye, with active choroidal neovascularization: Secondary | ICD-10-CM

## 2021-02-01 DIAGNOSIS — H43813 Vitreous degeneration, bilateral: Secondary | ICD-10-CM | POA: Diagnosis not present

## 2021-02-01 DIAGNOSIS — H35033 Hypertensive retinopathy, bilateral: Secondary | ICD-10-CM | POA: Diagnosis not present

## 2021-02-01 DIAGNOSIS — I10 Essential (primary) hypertension: Secondary | ICD-10-CM | POA: Diagnosis not present

## 2021-02-01 DIAGNOSIS — H353122 Nonexudative age-related macular degeneration, left eye, intermediate dry stage: Secondary | ICD-10-CM | POA: Diagnosis not present

## 2021-03-01 ENCOUNTER — Encounter (INDEPENDENT_AMBULATORY_CARE_PROVIDER_SITE_OTHER): Payer: Medicare Other | Admitting: Ophthalmology

## 2021-03-01 ENCOUNTER — Other Ambulatory Visit: Payer: Self-pay

## 2021-03-01 DIAGNOSIS — H353211 Exudative age-related macular degeneration, right eye, with active choroidal neovascularization: Secondary | ICD-10-CM | POA: Diagnosis not present

## 2021-03-01 DIAGNOSIS — H43813 Vitreous degeneration, bilateral: Secondary | ICD-10-CM | POA: Diagnosis not present

## 2021-03-01 DIAGNOSIS — I10 Essential (primary) hypertension: Secondary | ICD-10-CM

## 2021-03-01 DIAGNOSIS — H35033 Hypertensive retinopathy, bilateral: Secondary | ICD-10-CM

## 2021-03-01 DIAGNOSIS — H353122 Nonexudative age-related macular degeneration, left eye, intermediate dry stage: Secondary | ICD-10-CM

## 2021-03-07 DIAGNOSIS — H524 Presbyopia: Secondary | ICD-10-CM | POA: Diagnosis not present

## 2021-03-07 DIAGNOSIS — H353124 Nonexudative age-related macular degeneration, left eye, advanced atrophic with subfoveal involvement: Secondary | ICD-10-CM | POA: Diagnosis not present

## 2021-03-07 DIAGNOSIS — H353211 Exudative age-related macular degeneration, right eye, with active choroidal neovascularization: Secondary | ICD-10-CM | POA: Diagnosis not present

## 2021-03-07 DIAGNOSIS — Z961 Presence of intraocular lens: Secondary | ICD-10-CM | POA: Diagnosis not present

## 2021-03-07 DIAGNOSIS — H52223 Regular astigmatism, bilateral: Secondary | ICD-10-CM | POA: Diagnosis not present

## 2021-03-13 DIAGNOSIS — M1712 Unilateral primary osteoarthritis, left knee: Secondary | ICD-10-CM | POA: Diagnosis not present

## 2021-03-13 DIAGNOSIS — S76312A Strain of muscle, fascia and tendon of the posterior muscle group at thigh level, left thigh, initial encounter: Secondary | ICD-10-CM | POA: Diagnosis not present

## 2021-03-29 ENCOUNTER — Encounter (INDEPENDENT_AMBULATORY_CARE_PROVIDER_SITE_OTHER): Payer: Medicare Other | Admitting: Ophthalmology

## 2021-03-29 ENCOUNTER — Other Ambulatory Visit: Payer: Self-pay

## 2021-03-29 DIAGNOSIS — H353122 Nonexudative age-related macular degeneration, left eye, intermediate dry stage: Secondary | ICD-10-CM | POA: Diagnosis not present

## 2021-03-29 DIAGNOSIS — H353211 Exudative age-related macular degeneration, right eye, with active choroidal neovascularization: Secondary | ICD-10-CM | POA: Diagnosis not present

## 2021-03-29 DIAGNOSIS — H43813 Vitreous degeneration, bilateral: Secondary | ICD-10-CM | POA: Diagnosis not present

## 2021-03-29 DIAGNOSIS — I10 Essential (primary) hypertension: Secondary | ICD-10-CM | POA: Diagnosis not present

## 2021-03-29 DIAGNOSIS — H35033 Hypertensive retinopathy, bilateral: Secondary | ICD-10-CM

## 2021-04-26 ENCOUNTER — Other Ambulatory Visit: Payer: Self-pay

## 2021-04-26 ENCOUNTER — Encounter (INDEPENDENT_AMBULATORY_CARE_PROVIDER_SITE_OTHER): Payer: Medicare Other | Admitting: Ophthalmology

## 2021-04-26 DIAGNOSIS — H353211 Exudative age-related macular degeneration, right eye, with active choroidal neovascularization: Secondary | ICD-10-CM

## 2021-04-26 DIAGNOSIS — H353122 Nonexudative age-related macular degeneration, left eye, intermediate dry stage: Secondary | ICD-10-CM

## 2021-04-26 DIAGNOSIS — H43813 Vitreous degeneration, bilateral: Secondary | ICD-10-CM | POA: Diagnosis not present

## 2021-04-26 DIAGNOSIS — H35033 Hypertensive retinopathy, bilateral: Secondary | ICD-10-CM

## 2021-04-26 DIAGNOSIS — I10 Essential (primary) hypertension: Secondary | ICD-10-CM | POA: Diagnosis not present

## 2021-05-10 DIAGNOSIS — N3289 Other specified disorders of bladder: Secondary | ICD-10-CM | POA: Diagnosis not present

## 2021-05-10 DIAGNOSIS — Z87442 Personal history of urinary calculi: Secondary | ICD-10-CM | POA: Diagnosis not present

## 2021-05-10 DIAGNOSIS — N401 Enlarged prostate with lower urinary tract symptoms: Secondary | ICD-10-CM | POA: Diagnosis not present

## 2021-05-18 DIAGNOSIS — Z23 Encounter for immunization: Secondary | ICD-10-CM | POA: Diagnosis not present

## 2021-05-24 ENCOUNTER — Encounter (INDEPENDENT_AMBULATORY_CARE_PROVIDER_SITE_OTHER): Payer: Medicare Other | Admitting: Ophthalmology

## 2021-05-24 ENCOUNTER — Other Ambulatory Visit: Payer: Self-pay

## 2021-05-24 DIAGNOSIS — H35033 Hypertensive retinopathy, bilateral: Secondary | ICD-10-CM

## 2021-05-24 DIAGNOSIS — H353211 Exudative age-related macular degeneration, right eye, with active choroidal neovascularization: Secondary | ICD-10-CM

## 2021-05-24 DIAGNOSIS — H43813 Vitreous degeneration, bilateral: Secondary | ICD-10-CM | POA: Diagnosis not present

## 2021-05-24 DIAGNOSIS — I10 Essential (primary) hypertension: Secondary | ICD-10-CM | POA: Diagnosis not present

## 2021-05-24 DIAGNOSIS — H353122 Nonexudative age-related macular degeneration, left eye, intermediate dry stage: Secondary | ICD-10-CM | POA: Diagnosis not present

## 2021-06-09 DIAGNOSIS — B356 Tinea cruris: Secondary | ICD-10-CM | POA: Diagnosis not present

## 2021-06-09 DIAGNOSIS — L820 Inflamed seborrheic keratosis: Secondary | ICD-10-CM | POA: Diagnosis not present

## 2021-06-09 DIAGNOSIS — D0439 Carcinoma in situ of skin of other parts of face: Secondary | ICD-10-CM | POA: Diagnosis not present

## 2021-06-09 DIAGNOSIS — C44319 Basal cell carcinoma of skin of other parts of face: Secondary | ICD-10-CM | POA: Diagnosis not present

## 2021-06-21 ENCOUNTER — Other Ambulatory Visit: Payer: Self-pay

## 2021-06-21 ENCOUNTER — Encounter (INDEPENDENT_AMBULATORY_CARE_PROVIDER_SITE_OTHER): Payer: Medicare Other | Admitting: Ophthalmology

## 2021-06-21 DIAGNOSIS — H353231 Exudative age-related macular degeneration, bilateral, with active choroidal neovascularization: Secondary | ICD-10-CM | POA: Diagnosis not present

## 2021-06-21 DIAGNOSIS — H35033 Hypertensive retinopathy, bilateral: Secondary | ICD-10-CM

## 2021-06-21 DIAGNOSIS — I10 Essential (primary) hypertension: Secondary | ICD-10-CM

## 2021-06-21 DIAGNOSIS — H43813 Vitreous degeneration, bilateral: Secondary | ICD-10-CM

## 2021-06-23 DIAGNOSIS — K56600 Partial intestinal obstruction, unspecified as to cause: Secondary | ICD-10-CM | POA: Diagnosis not present

## 2021-06-23 DIAGNOSIS — Z79899 Other long term (current) drug therapy: Secondary | ICD-10-CM | POA: Diagnosis not present

## 2021-06-23 DIAGNOSIS — Z888 Allergy status to other drugs, medicaments and biological substances status: Secondary | ICD-10-CM | POA: Diagnosis not present

## 2021-06-23 DIAGNOSIS — K5651 Intestinal adhesions [bands], with partial obstruction: Secondary | ICD-10-CM | POA: Diagnosis present

## 2021-06-23 DIAGNOSIS — Z881 Allergy status to other antibiotic agents status: Secondary | ICD-10-CM | POA: Diagnosis not present

## 2021-06-23 DIAGNOSIS — R109 Unspecified abdominal pain: Secondary | ICD-10-CM | POA: Diagnosis present

## 2021-06-23 DIAGNOSIS — D509 Iron deficiency anemia, unspecified: Secondary | ICD-10-CM | POA: Diagnosis present

## 2021-06-23 DIAGNOSIS — Z5331 Laparoscopic surgical procedure converted to open procedure: Secondary | ICD-10-CM | POA: Diagnosis not present

## 2021-06-23 DIAGNOSIS — M1990 Unspecified osteoarthritis, unspecified site: Secondary | ICD-10-CM | POA: Diagnosis present

## 2021-06-23 DIAGNOSIS — Z882 Allergy status to sulfonamides status: Secondary | ICD-10-CM | POA: Diagnosis not present

## 2021-06-23 DIAGNOSIS — K4090 Unilateral inguinal hernia, without obstruction or gangrene, not specified as recurrent: Secondary | ICD-10-CM | POA: Diagnosis not present

## 2021-06-23 DIAGNOSIS — K8020 Calculus of gallbladder without cholecystitis without obstruction: Secondary | ICD-10-CM | POA: Diagnosis not present

## 2021-06-23 DIAGNOSIS — K565 Intestinal adhesions [bands], unspecified as to partial versus complete obstruction: Secondary | ICD-10-CM | POA: Diagnosis not present

## 2021-06-23 DIAGNOSIS — I10 Essential (primary) hypertension: Secondary | ICD-10-CM | POA: Diagnosis present

## 2021-06-23 DIAGNOSIS — K219 Gastro-esophageal reflux disease without esophagitis: Secondary | ICD-10-CM | POA: Diagnosis present

## 2021-06-23 DIAGNOSIS — R509 Fever, unspecified: Secondary | ICD-10-CM | POA: Diagnosis not present

## 2021-06-23 DIAGNOSIS — R1013 Epigastric pain: Secondary | ICD-10-CM | POA: Diagnosis not present

## 2021-06-23 DIAGNOSIS — K449 Diaphragmatic hernia without obstruction or gangrene: Secondary | ICD-10-CM | POA: Diagnosis present

## 2021-06-23 DIAGNOSIS — K56609 Unspecified intestinal obstruction, unspecified as to partial versus complete obstruction: Secondary | ICD-10-CM | POA: Diagnosis not present

## 2021-06-23 DIAGNOSIS — K5730 Diverticulosis of large intestine without perforation or abscess without bleeding: Secondary | ICD-10-CM | POA: Diagnosis not present

## 2021-06-23 DIAGNOSIS — J9811 Atelectasis: Secondary | ICD-10-CM | POA: Diagnosis not present

## 2021-07-19 ENCOUNTER — Encounter (INDEPENDENT_AMBULATORY_CARE_PROVIDER_SITE_OTHER): Payer: Medicare Other | Admitting: Ophthalmology

## 2021-07-19 ENCOUNTER — Other Ambulatory Visit: Payer: Self-pay

## 2021-07-19 DIAGNOSIS — I10 Essential (primary) hypertension: Secondary | ICD-10-CM

## 2021-07-19 DIAGNOSIS — H353231 Exudative age-related macular degeneration, bilateral, with active choroidal neovascularization: Secondary | ICD-10-CM | POA: Diagnosis not present

## 2021-07-19 DIAGNOSIS — H43813 Vitreous degeneration, bilateral: Secondary | ICD-10-CM | POA: Diagnosis not present

## 2021-07-19 DIAGNOSIS — H35033 Hypertensive retinopathy, bilateral: Secondary | ICD-10-CM

## 2021-07-28 DIAGNOSIS — E7800 Pure hypercholesterolemia, unspecified: Secondary | ICD-10-CM | POA: Diagnosis not present

## 2021-07-28 DIAGNOSIS — K565 Intestinal adhesions [bands], unspecified as to partial versus complete obstruction: Secondary | ICD-10-CM | POA: Diagnosis not present

## 2021-07-28 DIAGNOSIS — R809 Proteinuria, unspecified: Secondary | ICD-10-CM | POA: Diagnosis not present

## 2021-07-28 DIAGNOSIS — E1129 Type 2 diabetes mellitus with other diabetic kidney complication: Secondary | ICD-10-CM | POA: Diagnosis not present

## 2021-07-28 DIAGNOSIS — D500 Iron deficiency anemia secondary to blood loss (chronic): Secondary | ICD-10-CM | POA: Diagnosis not present

## 2021-08-09 DIAGNOSIS — Z0000 Encounter for general adult medical examination without abnormal findings: Secondary | ICD-10-CM | POA: Diagnosis not present

## 2021-08-16 ENCOUNTER — Encounter (INDEPENDENT_AMBULATORY_CARE_PROVIDER_SITE_OTHER): Payer: Medicare Other | Admitting: Ophthalmology

## 2021-08-16 ENCOUNTER — Other Ambulatory Visit: Payer: Self-pay

## 2021-08-16 DIAGNOSIS — H43813 Vitreous degeneration, bilateral: Secondary | ICD-10-CM | POA: Diagnosis not present

## 2021-08-16 DIAGNOSIS — H35033 Hypertensive retinopathy, bilateral: Secondary | ICD-10-CM | POA: Diagnosis not present

## 2021-08-16 DIAGNOSIS — I10 Essential (primary) hypertension: Secondary | ICD-10-CM | POA: Diagnosis not present

## 2021-08-16 DIAGNOSIS — H353231 Exudative age-related macular degeneration, bilateral, with active choroidal neovascularization: Secondary | ICD-10-CM

## 2021-09-06 DIAGNOSIS — Z6821 Body mass index (BMI) 21.0-21.9, adult: Secondary | ICD-10-CM | POA: Diagnosis not present

## 2021-09-06 DIAGNOSIS — D500 Iron deficiency anemia secondary to blood loss (chronic): Secondary | ICD-10-CM | POA: Diagnosis not present

## 2021-09-13 ENCOUNTER — Other Ambulatory Visit: Payer: Self-pay

## 2021-09-13 ENCOUNTER — Encounter (INDEPENDENT_AMBULATORY_CARE_PROVIDER_SITE_OTHER): Payer: Medicare Other | Admitting: Ophthalmology

## 2021-09-13 DIAGNOSIS — H35033 Hypertensive retinopathy, bilateral: Secondary | ICD-10-CM

## 2021-09-13 DIAGNOSIS — H43813 Vitreous degeneration, bilateral: Secondary | ICD-10-CM

## 2021-09-13 DIAGNOSIS — I10 Essential (primary) hypertension: Secondary | ICD-10-CM

## 2021-09-13 DIAGNOSIS — H353231 Exudative age-related macular degeneration, bilateral, with active choroidal neovascularization: Secondary | ICD-10-CM | POA: Diagnosis not present

## 2021-10-11 ENCOUNTER — Encounter (INDEPENDENT_AMBULATORY_CARE_PROVIDER_SITE_OTHER): Payer: Medicare Other | Admitting: Ophthalmology

## 2021-10-11 ENCOUNTER — Other Ambulatory Visit: Payer: Self-pay

## 2021-10-11 DIAGNOSIS — H353231 Exudative age-related macular degeneration, bilateral, with active choroidal neovascularization: Secondary | ICD-10-CM | POA: Diagnosis not present

## 2021-10-11 DIAGNOSIS — I10 Essential (primary) hypertension: Secondary | ICD-10-CM | POA: Diagnosis not present

## 2021-10-11 DIAGNOSIS — H43813 Vitreous degeneration, bilateral: Secondary | ICD-10-CM | POA: Diagnosis not present

## 2021-10-11 DIAGNOSIS — H35033 Hypertensive retinopathy, bilateral: Secondary | ICD-10-CM | POA: Diagnosis not present

## 2021-11-08 ENCOUNTER — Other Ambulatory Visit: Payer: Self-pay

## 2021-11-08 ENCOUNTER — Encounter (INDEPENDENT_AMBULATORY_CARE_PROVIDER_SITE_OTHER): Payer: Medicare Other | Admitting: Ophthalmology

## 2021-11-08 DIAGNOSIS — H43813 Vitreous degeneration, bilateral: Secondary | ICD-10-CM

## 2021-11-08 DIAGNOSIS — H35033 Hypertensive retinopathy, bilateral: Secondary | ICD-10-CM

## 2021-11-08 DIAGNOSIS — H353231 Exudative age-related macular degeneration, bilateral, with active choroidal neovascularization: Secondary | ICD-10-CM | POA: Diagnosis not present

## 2021-11-08 DIAGNOSIS — I10 Essential (primary) hypertension: Secondary | ICD-10-CM

## 2021-11-29 DIAGNOSIS — N401 Enlarged prostate with lower urinary tract symptoms: Secondary | ICD-10-CM | POA: Diagnosis not present

## 2021-11-29 DIAGNOSIS — Z8042 Family history of malignant neoplasm of prostate: Secondary | ICD-10-CM | POA: Diagnosis not present

## 2021-11-29 DIAGNOSIS — N3289 Other specified disorders of bladder: Secondary | ICD-10-CM | POA: Diagnosis not present

## 2021-12-05 DIAGNOSIS — R1084 Generalized abdominal pain: Secondary | ICD-10-CM | POA: Diagnosis not present

## 2021-12-06 ENCOUNTER — Encounter (INDEPENDENT_AMBULATORY_CARE_PROVIDER_SITE_OTHER): Payer: Medicare Other | Admitting: Ophthalmology

## 2021-12-06 DIAGNOSIS — H43813 Vitreous degeneration, bilateral: Secondary | ICD-10-CM | POA: Diagnosis not present

## 2021-12-06 DIAGNOSIS — H35033 Hypertensive retinopathy, bilateral: Secondary | ICD-10-CM | POA: Diagnosis not present

## 2021-12-06 DIAGNOSIS — I10 Essential (primary) hypertension: Secondary | ICD-10-CM

## 2021-12-06 DIAGNOSIS — H353231 Exudative age-related macular degeneration, bilateral, with active choroidal neovascularization: Secondary | ICD-10-CM | POA: Diagnosis not present

## 2021-12-13 DIAGNOSIS — K4091 Unilateral inguinal hernia, without obstruction or gangrene, recurrent: Secondary | ICD-10-CM | POA: Diagnosis not present

## 2021-12-15 DIAGNOSIS — D225 Melanocytic nevi of trunk: Secondary | ICD-10-CM | POA: Diagnosis not present

## 2021-12-15 DIAGNOSIS — L570 Actinic keratosis: Secondary | ICD-10-CM | POA: Diagnosis not present

## 2021-12-15 DIAGNOSIS — L821 Other seborrheic keratosis: Secondary | ICD-10-CM | POA: Diagnosis not present

## 2021-12-15 DIAGNOSIS — D485 Neoplasm of uncertain behavior of skin: Secondary | ICD-10-CM | POA: Diagnosis not present

## 2021-12-15 DIAGNOSIS — D2239 Melanocytic nevi of other parts of face: Secondary | ICD-10-CM | POA: Diagnosis not present

## 2021-12-22 DIAGNOSIS — I10 Essential (primary) hypertension: Secondary | ICD-10-CM | POA: Diagnosis not present

## 2021-12-22 DIAGNOSIS — K219 Gastro-esophageal reflux disease without esophagitis: Secondary | ICD-10-CM | POA: Diagnosis not present

## 2021-12-22 DIAGNOSIS — K4091 Unilateral inguinal hernia, without obstruction or gangrene, recurrent: Secondary | ICD-10-CM | POA: Diagnosis not present

## 2021-12-27 DIAGNOSIS — K56609 Unspecified intestinal obstruction, unspecified as to partial versus complete obstruction: Secondary | ICD-10-CM | POA: Diagnosis not present

## 2021-12-27 DIAGNOSIS — R1111 Vomiting without nausea: Secondary | ICD-10-CM | POA: Diagnosis not present

## 2021-12-27 DIAGNOSIS — I700 Atherosclerosis of aorta: Secondary | ICD-10-CM | POA: Diagnosis not present

## 2021-12-27 DIAGNOSIS — R112 Nausea with vomiting, unspecified: Secondary | ICD-10-CM | POA: Diagnosis not present

## 2021-12-27 DIAGNOSIS — K5730 Diverticulosis of large intestine without perforation or abscess without bleeding: Secondary | ICD-10-CM | POA: Diagnosis not present

## 2021-12-27 DIAGNOSIS — R197 Diarrhea, unspecified: Secondary | ICD-10-CM | POA: Diagnosis not present

## 2021-12-27 DIAGNOSIS — R1084 Generalized abdominal pain: Secondary | ICD-10-CM | POA: Diagnosis not present

## 2021-12-27 DIAGNOSIS — N400 Enlarged prostate without lower urinary tract symptoms: Secondary | ICD-10-CM | POA: Diagnosis not present

## 2021-12-27 DIAGNOSIS — R110 Nausea: Secondary | ICD-10-CM | POA: Diagnosis not present

## 2021-12-27 DIAGNOSIS — K8020 Calculus of gallbladder without cholecystitis without obstruction: Secondary | ICD-10-CM | POA: Diagnosis not present

## 2021-12-28 DIAGNOSIS — R109 Unspecified abdominal pain: Secondary | ICD-10-CM | POA: Diagnosis not present

## 2021-12-28 DIAGNOSIS — Z4682 Encounter for fitting and adjustment of non-vascular catheter: Secondary | ICD-10-CM | POA: Diagnosis not present

## 2021-12-28 DIAGNOSIS — I10 Essential (primary) hypertension: Secondary | ICD-10-CM | POA: Diagnosis not present

## 2021-12-28 DIAGNOSIS — N400 Enlarged prostate without lower urinary tract symptoms: Secondary | ICD-10-CM | POA: Diagnosis not present

## 2021-12-28 DIAGNOSIS — K567 Ileus, unspecified: Secondary | ICD-10-CM | POA: Diagnosis not present

## 2021-12-28 DIAGNOSIS — Z79899 Other long term (current) drug therapy: Secondary | ICD-10-CM | POA: Diagnosis not present

## 2021-12-28 DIAGNOSIS — Z87442 Personal history of urinary calculi: Secondary | ICD-10-CM | POA: Diagnosis not present

## 2021-12-28 DIAGNOSIS — Z886 Allergy status to analgesic agent status: Secondary | ICD-10-CM | POA: Diagnosis not present

## 2021-12-28 DIAGNOSIS — R197 Diarrhea, unspecified: Secondary | ICD-10-CM | POA: Diagnosis not present

## 2021-12-28 DIAGNOSIS — D649 Anemia, unspecified: Secondary | ICD-10-CM | POA: Diagnosis present

## 2021-12-28 DIAGNOSIS — M1990 Unspecified osteoarthritis, unspecified site: Secondary | ICD-10-CM | POA: Diagnosis not present

## 2021-12-28 DIAGNOSIS — E872 Acidosis, unspecified: Secondary | ICD-10-CM | POA: Diagnosis not present

## 2021-12-28 DIAGNOSIS — I700 Atherosclerosis of aorta: Secondary | ICD-10-CM | POA: Diagnosis not present

## 2021-12-28 DIAGNOSIS — K5939 Other megacolon: Secondary | ICD-10-CM | POA: Diagnosis not present

## 2021-12-28 DIAGNOSIS — Z8719 Personal history of other diseases of the digestive system: Secondary | ICD-10-CM | POA: Diagnosis not present

## 2021-12-28 DIAGNOSIS — R1084 Generalized abdominal pain: Secondary | ICD-10-CM | POA: Diagnosis not present

## 2021-12-28 DIAGNOSIS — Z882 Allergy status to sulfonamides status: Secondary | ICD-10-CM | POA: Diagnosis not present

## 2021-12-28 DIAGNOSIS — K5730 Diverticulosis of large intestine without perforation or abscess without bleeding: Secondary | ICD-10-CM | POA: Diagnosis not present

## 2021-12-28 DIAGNOSIS — Z881 Allergy status to other antibiotic agents status: Secondary | ICD-10-CM | POA: Diagnosis not present

## 2021-12-28 DIAGNOSIS — Z888 Allergy status to other drugs, medicaments and biological substances status: Secondary | ICD-10-CM | POA: Diagnosis not present

## 2021-12-28 DIAGNOSIS — K219 Gastro-esophageal reflux disease without esophagitis: Secondary | ICD-10-CM | POA: Diagnosis not present

## 2021-12-28 DIAGNOSIS — K5651 Intestinal adhesions [bands], with partial obstruction: Secondary | ICD-10-CM | POA: Diagnosis not present

## 2021-12-28 DIAGNOSIS — K56609 Unspecified intestinal obstruction, unspecified as to partial versus complete obstruction: Secondary | ICD-10-CM | POA: Diagnosis not present

## 2021-12-28 DIAGNOSIS — K6389 Other specified diseases of intestine: Secondary | ICD-10-CM | POA: Diagnosis not present

## 2021-12-28 DIAGNOSIS — R112 Nausea with vomiting, unspecified: Secondary | ICD-10-CM | POA: Diagnosis not present

## 2021-12-28 DIAGNOSIS — K8020 Calculus of gallbladder without cholecystitis without obstruction: Secondary | ICD-10-CM | POA: Diagnosis not present

## 2022-01-03 ENCOUNTER — Encounter (INDEPENDENT_AMBULATORY_CARE_PROVIDER_SITE_OTHER): Payer: Medicare Other | Admitting: Ophthalmology

## 2022-01-05 DIAGNOSIS — R3915 Urgency of urination: Secondary | ICD-10-CM | POA: Diagnosis not present

## 2022-01-05 DIAGNOSIS — R350 Frequency of micturition: Secondary | ICD-10-CM | POA: Diagnosis not present

## 2022-01-05 DIAGNOSIS — R351 Nocturia: Secondary | ICD-10-CM | POA: Diagnosis not present

## 2022-01-05 DIAGNOSIS — N401 Enlarged prostate with lower urinary tract symptoms: Secondary | ICD-10-CM | POA: Diagnosis not present

## 2022-01-09 ENCOUNTER — Encounter (INDEPENDENT_AMBULATORY_CARE_PROVIDER_SITE_OTHER): Payer: Medicare Other | Admitting: Ophthalmology

## 2022-01-09 DIAGNOSIS — H43813 Vitreous degeneration, bilateral: Secondary | ICD-10-CM

## 2022-01-09 DIAGNOSIS — H35033 Hypertensive retinopathy, bilateral: Secondary | ICD-10-CM

## 2022-01-09 DIAGNOSIS — H353231 Exudative age-related macular degeneration, bilateral, with active choroidal neovascularization: Secondary | ICD-10-CM | POA: Diagnosis not present

## 2022-01-09 DIAGNOSIS — I10 Essential (primary) hypertension: Secondary | ICD-10-CM

## 2022-01-11 DIAGNOSIS — Z7689 Persons encountering health services in other specified circumstances: Secondary | ICD-10-CM | POA: Diagnosis not present

## 2022-01-11 DIAGNOSIS — M25552 Pain in left hip: Secondary | ICD-10-CM | POA: Diagnosis not present

## 2022-01-11 DIAGNOSIS — I10 Essential (primary) hypertension: Secondary | ICD-10-CM | POA: Diagnosis not present

## 2022-01-11 DIAGNOSIS — K565 Intestinal adhesions [bands], unspecified as to partial versus complete obstruction: Secondary | ICD-10-CM | POA: Diagnosis not present

## 2022-01-23 DIAGNOSIS — M1612 Unilateral primary osteoarthritis, left hip: Secondary | ICD-10-CM | POA: Diagnosis not present

## 2022-01-26 DIAGNOSIS — M1612 Unilateral primary osteoarthritis, left hip: Secondary | ICD-10-CM | POA: Diagnosis not present

## 2022-01-26 DIAGNOSIS — M25552 Pain in left hip: Secondary | ICD-10-CM | POA: Diagnosis not present

## 2022-02-01 DIAGNOSIS — R3915 Urgency of urination: Secondary | ICD-10-CM | POA: Diagnosis not present

## 2022-02-01 DIAGNOSIS — R351 Nocturia: Secondary | ICD-10-CM | POA: Diagnosis not present

## 2022-02-01 DIAGNOSIS — R350 Frequency of micturition: Secondary | ICD-10-CM | POA: Diagnosis not present

## 2022-02-01 DIAGNOSIS — N401 Enlarged prostate with lower urinary tract symptoms: Secondary | ICD-10-CM | POA: Diagnosis not present

## 2022-02-21 ENCOUNTER — Encounter (INDEPENDENT_AMBULATORY_CARE_PROVIDER_SITE_OTHER): Payer: Medicare Other | Admitting: Ophthalmology

## 2022-02-21 DIAGNOSIS — H353231 Exudative age-related macular degeneration, bilateral, with active choroidal neovascularization: Secondary | ICD-10-CM

## 2022-02-21 DIAGNOSIS — H43813 Vitreous degeneration, bilateral: Secondary | ICD-10-CM | POA: Diagnosis not present

## 2022-02-21 DIAGNOSIS — I10 Essential (primary) hypertension: Secondary | ICD-10-CM

## 2022-02-21 DIAGNOSIS — H35033 Hypertensive retinopathy, bilateral: Secondary | ICD-10-CM | POA: Diagnosis not present

## 2022-03-04 DIAGNOSIS — Z888 Allergy status to other drugs, medicaments and biological substances status: Secondary | ICD-10-CM | POA: Diagnosis not present

## 2022-03-04 DIAGNOSIS — Z87442 Personal history of urinary calculi: Secondary | ICD-10-CM | POA: Diagnosis not present

## 2022-03-04 DIAGNOSIS — K5651 Intestinal adhesions [bands], with partial obstruction: Secondary | ICD-10-CM | POA: Diagnosis not present

## 2022-03-04 DIAGNOSIS — N281 Cyst of kidney, acquired: Secondary | ICD-10-CM | POA: Diagnosis not present

## 2022-03-04 DIAGNOSIS — R1084 Generalized abdominal pain: Secondary | ICD-10-CM | POA: Diagnosis not present

## 2022-03-04 DIAGNOSIS — R1030 Lower abdominal pain, unspecified: Secondary | ICD-10-CM | POA: Diagnosis not present

## 2022-03-04 DIAGNOSIS — K6389 Other specified diseases of intestine: Secondary | ICD-10-CM | POA: Diagnosis not present

## 2022-03-04 DIAGNOSIS — Z79899 Other long term (current) drug therapy: Secondary | ICD-10-CM | POA: Diagnosis not present

## 2022-03-04 DIAGNOSIS — K5730 Diverticulosis of large intestine without perforation or abscess without bleeding: Secondary | ICD-10-CM | POA: Diagnosis not present

## 2022-03-04 DIAGNOSIS — R109 Unspecified abdominal pain: Secondary | ICD-10-CM | POA: Diagnosis not present

## 2022-03-04 DIAGNOSIS — K449 Diaphragmatic hernia without obstruction or gangrene: Secondary | ICD-10-CM | POA: Diagnosis not present

## 2022-03-04 DIAGNOSIS — Z4682 Encounter for fitting and adjustment of non-vascular catheter: Secondary | ICD-10-CM | POA: Diagnosis not present

## 2022-03-04 DIAGNOSIS — K219 Gastro-esophageal reflux disease without esophagitis: Secondary | ICD-10-CM | POA: Diagnosis not present

## 2022-03-04 DIAGNOSIS — R52 Pain, unspecified: Secondary | ICD-10-CM | POA: Diagnosis not present

## 2022-03-04 DIAGNOSIS — R110 Nausea: Secondary | ICD-10-CM | POA: Diagnosis not present

## 2022-03-04 DIAGNOSIS — M1990 Unspecified osteoarthritis, unspecified site: Secondary | ICD-10-CM | POA: Diagnosis not present

## 2022-03-04 DIAGNOSIS — K56609 Unspecified intestinal obstruction, unspecified as to partial versus complete obstruction: Secondary | ICD-10-CM | POA: Diagnosis not present

## 2022-03-04 DIAGNOSIS — E872 Acidosis, unspecified: Secondary | ICD-10-CM | POA: Diagnosis not present

## 2022-03-04 DIAGNOSIS — Z881 Allergy status to other antibiotic agents status: Secondary | ICD-10-CM | POA: Diagnosis not present

## 2022-03-04 DIAGNOSIS — E860 Dehydration: Secondary | ICD-10-CM | POA: Diagnosis not present

## 2022-03-04 DIAGNOSIS — Z882 Allergy status to sulfonamides status: Secondary | ICD-10-CM | POA: Diagnosis not present

## 2022-03-04 DIAGNOSIS — D649 Anemia, unspecified: Secondary | ICD-10-CM | POA: Diagnosis not present

## 2022-03-04 DIAGNOSIS — N400 Enlarged prostate without lower urinary tract symptoms: Secondary | ICD-10-CM | POA: Diagnosis not present

## 2022-03-04 DIAGNOSIS — K5939 Other megacolon: Secondary | ICD-10-CM | POA: Diagnosis not present

## 2022-03-04 DIAGNOSIS — M160 Bilateral primary osteoarthritis of hip: Secondary | ICD-10-CM | POA: Diagnosis not present

## 2022-03-04 DIAGNOSIS — Z886 Allergy status to analgesic agent status: Secondary | ICD-10-CM | POA: Diagnosis not present

## 2022-03-04 DIAGNOSIS — I10 Essential (primary) hypertension: Secondary | ICD-10-CM | POA: Diagnosis not present

## 2022-03-14 DIAGNOSIS — E1110 Type 2 diabetes mellitus with ketoacidosis without coma: Secondary | ICD-10-CM | POA: Diagnosis not present

## 2022-03-14 DIAGNOSIS — K565 Intestinal adhesions [bands], unspecified as to partial versus complete obstruction: Secondary | ICD-10-CM | POA: Diagnosis not present

## 2022-03-14 DIAGNOSIS — E1129 Type 2 diabetes mellitus with other diabetic kidney complication: Secondary | ICD-10-CM | POA: Diagnosis not present

## 2022-03-14 DIAGNOSIS — R809 Proteinuria, unspecified: Secondary | ICD-10-CM | POA: Diagnosis not present

## 2022-04-04 ENCOUNTER — Encounter (INDEPENDENT_AMBULATORY_CARE_PROVIDER_SITE_OTHER): Payer: Medicare Other | Admitting: Ophthalmology

## 2022-04-04 DIAGNOSIS — H35033 Hypertensive retinopathy, bilateral: Secondary | ICD-10-CM

## 2022-04-04 DIAGNOSIS — I10 Essential (primary) hypertension: Secondary | ICD-10-CM | POA: Diagnosis not present

## 2022-04-04 DIAGNOSIS — H43813 Vitreous degeneration, bilateral: Secondary | ICD-10-CM

## 2022-04-04 DIAGNOSIS — H353231 Exudative age-related macular degeneration, bilateral, with active choroidal neovascularization: Secondary | ICD-10-CM | POA: Diagnosis not present

## 2022-05-02 DIAGNOSIS — M1612 Unilateral primary osteoarthritis, left hip: Secondary | ICD-10-CM | POA: Diagnosis not present

## 2022-05-02 DIAGNOSIS — M25552 Pain in left hip: Secondary | ICD-10-CM | POA: Diagnosis not present

## 2022-05-14 DIAGNOSIS — J309 Allergic rhinitis, unspecified: Secondary | ICD-10-CM | POA: Diagnosis not present

## 2022-05-14 NOTE — Progress Notes (Signed)
COVID Vaccine received:  '[]'$  No '[x]'$  Yes Date of any COVID positive Test in last 90 days:  PCP - Serita Grammes, MD Medical clearance on chart  Cardiologist -   Chest x-ray -  EKG -   Stress Test -  ECHO -  Cardiac Cath -   Pacemaker/ICD device     '[]'$  N/A Spinal Cord Stimulator:'[]'$  No '[]'$  Yes      (Remind patient to bring remote DOS) Other Implants:   History of Sleep Apnea? '[]'$  No '[]'$  Yes   Sleep Study Date:   CPAP used?- '[]'$  No '[]'$  Yes  (Instruct to bring their mask & Tubing)  Does the patient monitor blood sugar? '[]'$  No '[]'$  Yes  '[]'$  N/A Does patient have a [employer] or Dexacom? '[]'$  No '[]'$  Yes   Fasting Blood Sugar Ranges-  Checks Blood Sugar _____ times a day  Blood Thinner Instructions: Aspirin Instructions: Last Dose:  ERAS Protocol Ordered: '[]'$  No  '[x]'$  Yes PRE-SURGERY '[x]'$  ENSURE  '[]'$  G2   Comments:   Activity level: Patient can / can not climb a flight of stairs without difficulty;  '[]'$  No CP  '[]'$  No SOB,  but would have ______   Anesthesia review: HTN, Pre-DM, Hx Heart Murmur,   Patient denies shortness of breath, fever, cough and chest pain at PAT appointment.  Patient verbalized understanding and agreement to the Pre-Surgical Instructions that were given to them at this PAT appointment. Patient was also educated of the need to review these PAT instructions again prior to his/her surgery.I reviewed the appropriate phone numbers to call if they have any and questions or concerns.

## 2022-05-14 NOTE — Patient Instructions (Signed)
SURGICAL WAITING ROOM VISITATION Patients having surgery or a procedure may have no more than 2 support people in the waiting area - these visitors may rotate in the visitor waiting room.   Children under the age of 82 must have an adult with them who is not the patient. If the patient needs to stay at the hospital during part of their recovery, the visitor guidelines for inpatient rooms apply.  PRE-OP VISITATION  Pre-op nurse will coordinate an appropriate time for 1 support person to accompany the patient in pre-op.  This support person may not rotate.  This visitor will be contacted when the time is appropriate for the visitor to come back in the pre-op area.  Please refer to the [facility] website for the visitor guidelines for Inpatients (after your surgery is over and you are in a regular room).  You are not required to quarantine at this time prior to your surgery. However, you must do this: Hand Hygiene often Do NOT share personal items Notify your provider if you are in close contact with someone who has COVID or you develop fever 100.4 or greater, new onset of sneezing, cough, sore throat, shortness of breath or body aches.   If you received a COVID test during your pre-op visit  it is requested that you wear a mask when out in public, stay away from anyone that may not be feeling well and notify your surgeon if you develop symptoms. If you test positive for Covid or have been in contact with anyone that has tested positive in the last 10 days please notify you surgeon.       Your procedure is scheduled on:  Tuesday  May 22, 2022  Report to [facility] Main Entrance.  Report to admitting at: 10:30    AM  +++++Call this number if you have any questions or problems the morning of surgery [phone]  Do not eat food :After Midnight the night prior to your surgery/procedure.  After Midnight you may have the following liquids until 10:00 AM  DAY OF SURGERY  Clear  Liquid Diet Water Black Coffee (sugar ok, NO MILK/CREAM OR CREAMERS)  Tea (sugar ok, NO MILK/CREAM OR CREAMERS) regular and decaf                             Plain Jell-O  with no fruit (NO RED)                                           Fruit ices (not with fruit pulp, NO RED)                                     Popsicles (NO RED)                                                                  Juice: apple, WHITE grape, WHITE cranberry Sports drinks like Gatorade or Powerade (NO RED)  The day of surgery:  Drink ONE (1) Pre-Surgery G2 at  10:00  AM the morning of surgery. Drink in one sitting. Do not sip.  This drink was given to you during your hospital pre-op appointment visit. Nothing else to drink after completing the Pre-Surgery G2 : No candy, chewing gum or throat lozenges.    FOLLOW ANY ADDITIONAL PRE OP INSTRUCTIONS YOU RECEIVED FROM YOUR SURGEON'S OFFICE!!!   Oral Hygiene is also important to reduce your risk of infection.        Remember - BRUSH YOUR TEETH THE MORNING OF SURGERY WITH YOUR REGULAR TOOTHPASTE  Do NOT smoke after Midnight the night before surgery.  Take ONLY these medicines the morning of surgery with A SIP OF WATER:   none   You may not have any metal on your body including  jewelry, and body piercing  Do not wear  lotions, powders,  cologne, or deodorant   Men may shave face and neck.  Contacts, Hearing Aids, dentures or bridgework may not be worn into surgery.   You may bring a small overnight bag with you on the day of surgery, only pack items that are not valuable .[facility] IS NOT RESPONSIBLE   FOR VALUABLES THAT ARE LOST OR STOLEN.   DO NOT [place]. PHARMACY WILL DISPENSE MEDICATIONS LISTED ON YOUR MEDICATION LIST TO YOU DURING YOUR ADMISSION [facility]!   Special Instructions: Bring a copy of your healthcare power of attorney and living will documents the day of surgery, if you  wish to have them scanned into your [facility] Records- EPIC  Please read over the following fact sheets you were given: IF YOU HAVE QUESTIONS ABOUT YOUR PRE-OP INSTRUCTIONS, PLEASE CALL [phone]  ([place])   [facility] - Preparing for Surgery Before surgery, you can play an important role.  Because skin is not sterile, your skin needs to be as free of germs as possible.  You can reduce the number of germs on your skin by washing with CHG (chlorahexidine gluconate) soap before surgery.  CHG is an antiseptic cleaner which kills germs and bonds with the skin to continue killing germs even after washing. Please DO NOT use if you have an allergy to CHG or antibacterial soaps.  If your skin becomes reddened/irritated stop using the CHG and inform your nurse when you arrive at Short Stay. Do not shave (including legs and underarms) for at least 48 hours prior to the first CHG shower.  You may shave your face/neck.  Please follow these instructions carefully:  1.  Shower with CHG Soap the night before surgery and the  morning of surgery.  2.  If you choose to wash your hair, wash your hair first as usual with your normal  shampoo.  3.  After you shampoo, rinse your hair and body thoroughly to remove the shampoo.                             4.  Use CHG as you would any other liquid soap.  You can apply chg directly to the skin and wash.  Gently with a scrungie or clean washcloth.  5.  Apply the CHG Soap to your body ONLY FROM THE NECK DOWN.   Do not use on face/ open  Wound or open sores. Avoid contact with eyes, ears mouth and genitals (private parts).                       Wash face,  Genitals (private parts) with your normal soap.             6.  Wash thoroughly, paying special attention to the area where your  surgery  will be performed.  7.  Thoroughly rinse your body with warm water from the neck down.  8.  DO NOT shower/wash with your normal soap after using and  rinsing off the CHG Soap.            9.  Pat yourself dry with a clean towel.            10.  Wear clean pajamas.            11.  Place clean sheets on your bed the night of your first shower and do not  sleep with pets.  ON THE DAY OF SURGERY : Do not apply any lotions/deodorants the morning of surgery.  Please wear clean clothes to the hospital/surgery center.    FAILURE TO FOLLOW THESE INSTRUCTIONS MAY RESULT IN THE CANCELLATION OF YOUR SURGERY  PATIENT SIGNATURE_________________________________  NURSE SIGNATURE__________________________________  ________________________________________________________________________       Julian Davis    An incentive spirometer is a tool that can help keep your lungs clear and active. This tool measures how well you are filling your lungs with each breath. Taking long deep breaths may help reverse or decrease the chance of developing breathing (pulmonary) problems (especially infection) following: A long period of time when you are unable to move or be active. BEFORE THE PROCEDURE  If the spirometer includes an indicator to show your best effort, your nurse or respiratory therapist will set it to a desired goal. If possible, sit up straight or lean slightly forward. Try not to slouch. Hold the incentive spirometer in an upright position. INSTRUCTIONS FOR USE  Sit on the edge of your bed if possible, or sit up as far as you can in bed or on a chair. Hold the incentive spirometer in an upright position. Breathe out normally. Place the mouthpiece in your mouth and seal your lips tightly around it. Breathe in slowly and as deeply as possible, raising the piston or the ball toward the top of the column. Hold your breath for 3-5 seconds or for as long as possible. Allow the piston or ball to fall to the bottom of the column. Remove the mouthpiece from your mouth and breathe out normally. Rest for a few seconds and repeat Steps 1 through  7 at least 10 times every 1-2 hours when you are awake. Take your time and take a few normal breaths between deep breaths. The spirometer may include an indicator to show your best effort. Use the indicator as a goal to work toward during each repetition. After each set of 10 deep breaths, practice coughing to be sure your lungs are clear. If you have an incision (the cut made at the time of surgery), support your incision when coughing by placing a pillow or rolled up towels firmly against it. Once you are able to get out of bed, walk around indoors and cough well. You may stop using the incentive spirometer when instructed by your caregiver.  RISKS AND COMPLICATIONS Take your time so you do not get dizzy or light-headed. If you are  in pain, you may need to take or ask for pain medication before doing incentive spirometry. It is harder to take a deep breath if you are having pain. AFTER USE Rest and breathe slowly and easily. It can be helpful to keep track of a log of your progress. Your caregiver can provide you with a simple table to help with this. If you are using the spirometer at home, follow these instructions: Julian Davis IF:  You are having difficultly using the spirometer. You have trouble using the spirometer as often as instructed. Your pain medication is not giving enough relief while using the spirometer. You develop fever of 100.5 F (38.1 C) or higher.                                                                                                    SEEK IMMEDIATE MEDICAL CARE IF:  You cough up bloody sputum that had not been present before. You develop fever of 102 F (38.9 C) or greater. You develop worsening pain at or near the incision site. MAKE SURE YOU:  Understand these instructions. Will watch your condition. Will get help right away if you are not doing well or get worse. Document Released: 12/10/2006 Document Revised: 10/22/2011 Document Reviewed:  02/10/2007 [employer] Patient Information 2014 [place], [place].

## 2022-05-16 ENCOUNTER — Encounter (HOSPITAL_COMMUNITY)
Admission: RE | Admit: 2022-05-16 | Discharge: 2022-05-16 | Disposition: A | Discharge status: Home / Self Care | Payer: Medicare Other | Source: Ambulatory Visit | Attending: Orthopedic Surgery | Admitting: Orthopedic Surgery

## 2022-05-16 ENCOUNTER — Encounter (HOSPITAL_COMMUNITY): Payer: Self-pay

## 2022-05-16 ENCOUNTER — Other Ambulatory Visit: Payer: Self-pay

## 2022-05-16 VITALS — BP 150/76 | HR 62 | Temp 98.3°F | Resp 14 | Ht 68.0 in | Wt 134.0 lb

## 2022-05-16 DIAGNOSIS — M1612 Unilateral primary osteoarthritis, left hip: Secondary | ICD-10-CM | POA: Insufficient documentation

## 2022-05-16 DIAGNOSIS — I10 Essential (primary) hypertension: Secondary | ICD-10-CM | POA: Diagnosis not present

## 2022-05-16 DIAGNOSIS — Z01818 Encounter for other preprocedural examination: Secondary | ICD-10-CM | POA: Diagnosis not present

## 2022-05-16 DIAGNOSIS — R7303 Prediabetes: Secondary | ICD-10-CM | POA: Insufficient documentation

## 2022-05-16 LAB — BASIC METABOLIC PANEL
Anion gap: 6 (ref 5–15)
BUN: 16 mg/dL (ref 8–23)
CO2: 29 mmol/L (ref 22–32)
Calcium: 8.9 mg/dL (ref 8.9–10.3)
Chloride: 104 mmol/L (ref 98–111)
Creatinine, Ser: 0.76 mg/dL (ref 0.61–1.24)
GFR, Estimated: >60 mL/min (ref >60)
Glucose, Bld: 100 mg/dL — ABNORMAL HIGH (ref 70–99)
Potassium: 4.2 mmol/L (ref 3.5–5.1)
Sodium: 139 mmol/L (ref 135–145)

## 2022-05-16 LAB — CBC
HCT: 45.7 % (ref 39.0–52.0)
Hemoglobin: 15.1 g/dL (ref 13.0–17.0)
MCH: 33.9 pg (ref 26.0–34.0)
MCHC: 33 g/dL (ref 30.0–36.0)
MCV: 102.5 fL — ABNORMAL HIGH (ref 80.0–100.0)
Platelets: 149 10*3/uL — ABNORMAL LOW (ref 150–400)
RBC: 4.46 MIL/uL (ref 4.22–5.81)
RDW: 13.1 % (ref 11.5–15.5)
WBC: 3.7 10*3/uL — ABNORMAL LOW (ref 4.0–10.5)
nRBC: 0 % (ref 0.0–0.2)

## 2022-05-16 LAB — SURGICAL PCR SCREEN
MRSA, PCR: NEGATIVE
Staphylococcus aureus: NEGATIVE

## 2022-05-16 LAB — HEMOGLOBIN A1C
Hgb A1c MFr Bld: 5.2 % (ref 4.8–5.6)
Mean Plasma Glucose: 102.54 mg/dL

## 2022-05-16 LAB — GLUCOSE, CAPILLARY: Glucose-Capillary: 108 mg/dL — ABNORMAL HIGH (ref 70–99)

## 2022-05-21 NOTE — Anesthesia Preprocedure Evaluation (Signed)
Anesthesia Evaluation  Patient identified by MRN, date of birth, ID band Patient awake    Reviewed: Allergy & Precautions, NPO status , Patient's Chart, lab work & pertinent test results  History of Anesthesia Complications Negative for: history of anesthetic complications  Airway Mallampati: III  TM Distance: >3 FB Neck ROM: Full    Dental no notable dental hx. (+) Dental Advisory Given   Pulmonary neg pulmonary ROS,    Pulmonary exam normal        Cardiovascular hypertension, Pt. on medications Normal cardiovascular exam     Neuro/Psych negative neurological ROS     GI/Hepatic negative GI ROS, Neg liver ROS,   Endo/Other  negative endocrine ROS  Renal/GU negative Renal ROS     Musculoskeletal negative musculoskeletal ROS (+)   Abdominal   Peds  Hematology negative hematology ROS (+)   Anesthesia Other Findings   Reproductive/Obstetrics                            Anesthesia Physical Anesthesia Plan  ASA: 2  Anesthesia Plan: Spinal and MAC   Post-op Pain Management: Tylenol PO (pre-op)*   Induction:   PONV Risk Score and Plan: 2 and Ondansetron and Propofol infusion  Airway Management Planned: Natural Airway  Additional Equipment:   Intra-op Plan:   Post-operative Plan:   Informed Consent: I have reviewed the patients History and Physical, chart, labs and discussed the procedure including the risks, benefits and alternatives for the proposed anesthesia with the patient or authorized representative who has indicated his/her understanding and acceptance.     Dental advisory given  Plan Discussed with: Anesthesiologist and CRNA  Anesthesia Plan Comments:        Anesthesia Quick Evaluation

## 2022-05-22 ENCOUNTER — Ambulatory Visit (HOSPITAL_COMMUNITY): Payer: Medicare Other

## 2022-05-22 ENCOUNTER — Ambulatory Visit (HOSPITAL_BASED_OUTPATIENT_CLINIC_OR_DEPARTMENT_OTHER): Payer: Medicare Other | Admitting: Certified Registered Nurse Anesthetist

## 2022-05-22 ENCOUNTER — Other Ambulatory Visit: Payer: Self-pay

## 2022-05-22 ENCOUNTER — Ambulatory Visit (HOSPITAL_COMMUNITY): Payer: Medicare Other | Admitting: Physician Assistant

## 2022-05-22 ENCOUNTER — Encounter (HOSPITAL_COMMUNITY)
Admission: RE | Disposition: A | Discharge status: Home / Self Care | Payer: Self-pay | Source: Ambulatory Visit | Attending: Orthopedic Surgery

## 2022-05-22 ENCOUNTER — Encounter (HOSPITAL_COMMUNITY): Payer: Self-pay | Admitting: Orthopedic Surgery

## 2022-05-22 ENCOUNTER — Observation Stay (HOSPITAL_COMMUNITY)
Admission: RE | Admit: 2022-05-22 | Discharge: 2022-05-23 | Disposition: A | Discharge status: Home / Self Care | Payer: Medicare Other | Source: Ambulatory Visit | Attending: Orthopedic Surgery | Admitting: Orthopedic Surgery

## 2022-05-22 ENCOUNTER — Observation Stay (HOSPITAL_COMMUNITY): Payer: Medicare Other

## 2022-05-22 DIAGNOSIS — Z96651 Presence of right artificial knee joint: Secondary | ICD-10-CM | POA: Insufficient documentation

## 2022-05-22 DIAGNOSIS — I10 Essential (primary) hypertension: Secondary | ICD-10-CM | POA: Insufficient documentation

## 2022-05-22 DIAGNOSIS — Z471 Aftercare following joint replacement surgery: Secondary | ICD-10-CM | POA: Diagnosis not present

## 2022-05-22 DIAGNOSIS — Z96642 Presence of left artificial hip joint: Secondary | ICD-10-CM

## 2022-05-22 DIAGNOSIS — M1612 Unilateral primary osteoarthritis, left hip: Principal | ICD-10-CM | POA: Insufficient documentation

## 2022-05-22 DIAGNOSIS — R7303 Prediabetes: Secondary | ICD-10-CM

## 2022-05-22 HISTORY — PX: TOTAL HIP ARTHROPLASTY: SHX124

## 2022-05-22 LAB — TYPE AND SCREEN
ABO/RH(D): A POS
Antibody Screen: NEGATIVE

## 2022-05-22 SURGERY — ARTHROPLASTY, HIP, TOTAL, ANTERIOR APPROACH
Anesthesia: Monitor Anesthesia Care | Site: Hip | Laterality: Left

## 2022-05-22 MED ORDER — TRANEXAMIC ACID-NACL 1000-0.7 MG/100ML-% IV SOLN
1000.0000 mg | INTRAVENOUS | Status: AC
  Administered 2022-05-22: 1000 mg via INTRAVENOUS
  Filled 2022-05-22: qty 100

## 2022-05-22 MED ORDER — FERROUS SULFATE 325 (65 FE) MG PO TABS
325.0000 mg | ORAL_TABLET | Freq: Three times a day (TID) | ORAL | Status: DC
  Filled 2022-05-22: qty 1

## 2022-05-22 MED ORDER — PHENYLEPHRINE HCL-NACL 20-0.9 MG/250ML-% IV SOLN
INTRAVENOUS | Status: AC
  Filled 2022-05-22: qty 500

## 2022-05-22 MED ORDER — ONDANSETRON HCL 4 MG PO TABS: 4.0000 mg | ORAL_TABLET | Freq: Four times a day (QID) | ORAL | Status: DC | PRN

## 2022-05-22 MED ORDER — PHENYLEPHRINE 80 MCG/ML (10ML) SYRINGE FOR IV PUSH (FOR BLOOD PRESSURE SUPPORT)
PREFILLED_SYRINGE | INTRAVENOUS | Status: DC | PRN
  Administered 2022-05-22 (×4): 80 ug via INTRAVENOUS

## 2022-05-22 MED ORDER — TRAMADOL HCL 50 MG PO TABS
50.0000 mg | ORAL_TABLET | Freq: Four times a day (QID) | ORAL | Status: DC | PRN
  Administered 2022-05-22 – 2022-05-23 (×3): 100 mg via ORAL
  Filled 2022-05-22 (×3): qty 2

## 2022-05-22 MED ORDER — FENTANYL CITRATE (PF) 100 MCG/2ML IJ SOLN
INTRAMUSCULAR | Status: DC | PRN
  Administered 2022-05-22: 50 ug via INTRAVENOUS

## 2022-05-22 MED ORDER — ACETAMINOPHEN 500 MG PO TABS
ORAL_TABLET | ORAL | Status: AC
  Filled 2022-05-22: qty 2

## 2022-05-22 MED ORDER — POVIDONE-IODINE 10 % EX SWAB
2.0000 | Freq: Once | CUTANEOUS | Status: AC
  Administered 2022-05-22: 2 via TOPICAL

## 2022-05-22 MED ORDER — OXYMETAZOLINE HCL 0.05 % NA SOLN: 1.0000 | Freq: Every day | NASAL | Status: DC | PRN

## 2022-05-22 MED ORDER — SENNA 8.6 MG PO TABS
2.0000 | ORAL_TABLET | Freq: Every day | ORAL | Status: DC
  Administered 2022-05-22: 17.2 mg via ORAL
  Filled 2022-05-22: qty 2

## 2022-05-22 MED ORDER — BENAZEPRIL HCL 20 MG PO TABS: 10.0000 mg | ORAL_TABLET | Freq: Every day | ORAL | Status: DC

## 2022-05-22 MED ORDER — FENTANYL CITRATE PF 50 MCG/ML IJ SOSY
PREFILLED_SYRINGE | INTRAMUSCULAR | Status: AC
  Filled 2022-05-22: qty 1

## 2022-05-22 MED ORDER — DEXAMETHASONE SODIUM PHOSPHATE 10 MG/ML IJ SOLN
10.0000 mg | Freq: Once | INTRAMUSCULAR | Status: AC
  Administered 2022-05-23: 10 mg via INTRAVENOUS
  Filled 2022-05-22: qty 1

## 2022-05-22 MED ORDER — SODIUM CHLORIDE 0.9 % IV SOLN: INTRAVENOUS | Status: DC

## 2022-05-22 MED ORDER — DEXAMETHASONE SODIUM PHOSPHATE 10 MG/ML IJ SOLN
INTRAMUSCULAR | Status: AC
  Filled 2022-05-22: qty 1

## 2022-05-22 MED ORDER — MENTHOL 3 MG MT LOZG: 1.0000 | LOZENGE | OROMUCOSAL | Status: DC | PRN

## 2022-05-22 MED ORDER — CEFAZOLIN SODIUM-DEXTROSE 2-4 GM/100ML-% IV SOLN
2.0000 g | INTRAVENOUS | Status: AC
  Administered 2022-05-22: 2 g via INTRAVENOUS
  Filled 2022-05-22: qty 100

## 2022-05-22 MED ORDER — METOCLOPRAMIDE HCL 5 MG/ML IJ SOLN: 5.0000 mg | Freq: Three times a day (TID) | INTRAMUSCULAR | Status: DC | PRN

## 2022-05-22 MED ORDER — PROMETHAZINE HCL 25 MG/ML IJ SOLN: 6.2500 mg | INTRAMUSCULAR | Status: DC | PRN

## 2022-05-22 MED ORDER — FENTANYL CITRATE PF 50 MCG/ML IJ SOSY
25.0000 ug | PREFILLED_SYRINGE | INTRAMUSCULAR | Status: DC | PRN
  Administered 2022-05-22: 50 ug via INTRAVENOUS
  Administered 2022-05-22 (×2): 25 ug via INTRAVENOUS

## 2022-05-22 MED ORDER — SODIUM CHLORIDE 0.9 % IR SOLN: Status: DC | PRN

## 2022-05-22 MED ORDER — ACETAMINOPHEN 325 MG PO TABS: 325.0000 mg | ORAL_TABLET | Freq: Four times a day (QID) | ORAL | Status: DC | PRN

## 2022-05-22 MED ORDER — METHOCARBAMOL 500 MG IVPB - SIMPLE MED
INTRAVENOUS | Status: AC
  Filled 2022-05-22: qty 55

## 2022-05-22 MED ORDER — ORAL CARE MOUTH RINSE: 15.0000 mL | Freq: Once | OROMUCOSAL | Status: AC

## 2022-05-22 MED ORDER — AMISULPRIDE (ANTIEMETIC) 5 MG/2ML IV SOLN: 10.0000 mg | Freq: Once | INTRAVENOUS | Status: DC | PRN

## 2022-05-22 MED ORDER — ACETAMINOPHEN 500 MG PO TABS
1000.0000 mg | ORAL_TABLET | Freq: Once | ORAL | Status: AC
  Administered 2022-05-22: 1000 mg via ORAL
  Filled 2022-05-22: qty 2

## 2022-05-22 MED ORDER — PROPOFOL 1000 MG/100ML IV EMUL
INTRAVENOUS | Status: AC
  Filled 2022-05-22: qty 100

## 2022-05-22 MED ORDER — LACTATED RINGERS IV SOLN: INTRAVENOUS | Status: DC

## 2022-05-22 MED ORDER — PROPOFOL 10 MG/ML IV BOLUS
INTRAVENOUS | Status: DC | PRN
  Administered 2022-05-22: 20 mg via INTRAVENOUS
  Administered 2022-05-22: 10 mg via INTRAVENOUS
  Administered 2022-05-22: 20 mg via INTRAVENOUS

## 2022-05-22 MED ORDER — BISACODYL 10 MG RE SUPP: 10.0000 mg | Freq: Every day | RECTAL | Status: DC | PRN

## 2022-05-22 MED ORDER — MORPHINE SULFATE (PF) 2 MG/ML IV SOLN
0.5000 mg | INTRAVENOUS | Status: DC | PRN
  Administered 2022-05-22 (×2): 0.5 mg via INTRAVENOUS
  Filled 2022-05-22: qty 1

## 2022-05-22 MED ORDER — ONDANSETRON HCL 4 MG/2ML IJ SOLN
INTRAMUSCULAR | Status: DC | PRN
  Administered 2022-05-22: 4 mg via INTRAVENOUS

## 2022-05-22 MED ORDER — EPHEDRINE SULFATE-NACL 50-0.9 MG/10ML-% IV SOSY
PREFILLED_SYRINGE | INTRAVENOUS | Status: DC | PRN
  Administered 2022-05-22: 5 mg via INTRAVENOUS
  Administered 2022-05-22 (×2): 7.5 mg via INTRAVENOUS

## 2022-05-22 MED ORDER — DIPHENHYDRAMINE HCL 12.5 MG/5ML PO ELIX: 12.5000 mg | ORAL_SOLUTION | ORAL | Status: DC | PRN

## 2022-05-22 MED ORDER — METOCLOPRAMIDE HCL 5 MG PO TABS: 5.0000 mg | ORAL_TABLET | Freq: Three times a day (TID) | ORAL | Status: DC | PRN

## 2022-05-22 MED ORDER — CEFAZOLIN SODIUM-DEXTROSE 2-4 GM/100ML-% IV SOLN
2.0000 g | Freq: Four times a day (QID) | INTRAVENOUS | Status: AC
  Administered 2022-05-22 – 2022-05-23 (×2): 2 g via INTRAVENOUS
  Filled 2022-05-22 (×2): qty 100

## 2022-05-22 MED ORDER — PANTOPRAZOLE SODIUM 40 MG PO TBEC
40.0000 mg | DELAYED_RELEASE_TABLET | Freq: Every day | ORAL | Status: DC
  Administered 2022-05-23: 40 mg via ORAL
  Filled 2022-05-22: qty 1

## 2022-05-22 MED ORDER — ACETAMINOPHEN 500 MG PO TABS
1000.0000 mg | ORAL_TABLET | Freq: Four times a day (QID) | ORAL | Status: DC
  Administered 2022-05-22 – 2022-05-23 (×4): 1000 mg via ORAL
  Filled 2022-05-22 (×3): qty 2

## 2022-05-22 MED ORDER — PHENOL 1.4 % MT LIQD: 1.0000 | OROMUCOSAL | Status: DC | PRN

## 2022-05-22 MED ORDER — BUPIVACAINE IN DEXTROSE 0.75-8.25 % IT SOLN
INTRATHECAL | Status: DC | PRN
  Administered 2022-05-22: 1.8 mL via INTRATHECAL

## 2022-05-22 MED ORDER — 0.9 % SODIUM CHLORIDE (POUR BTL) OPTIME
TOPICAL | Status: DC | PRN
  Administered 2022-05-22: 1000 mL

## 2022-05-22 MED ORDER — MORPHINE SULFATE (PF) 2 MG/ML IV SOLN
INTRAVENOUS | Status: AC
  Filled 2022-05-22: qty 1

## 2022-05-22 MED ORDER — TRAMADOL HCL 50 MG PO TABS
ORAL_TABLET | ORAL | Status: AC
  Filled 2022-05-22: qty 2

## 2022-05-22 MED ORDER — POLYETHYLENE GLYCOL 3350 17 G PO PACK
17.0000 g | PACK | Freq: Every day | ORAL | Status: DC
  Administered 2022-05-23: 17 g via ORAL
  Filled 2022-05-22: qty 1

## 2022-05-22 MED ORDER — FENTANYL CITRATE (PF) 100 MCG/2ML IJ SOLN
INTRAMUSCULAR | Status: AC
  Filled 2022-05-22: qty 2

## 2022-05-22 MED ORDER — STERILE WATER FOR IRRIGATION IR SOLN
Status: DC | PRN
  Administered 2022-05-22: 2000 mL

## 2022-05-22 MED ORDER — DEXAMETHASONE SODIUM PHOSPHATE 10 MG/ML IJ SOLN
8.0000 mg | Freq: Once | INTRAMUSCULAR | Status: AC
  Administered 2022-05-22: 4 mg via INTRAVENOUS

## 2022-05-22 MED ORDER — PROPOFOL 10 MG/ML IV BOLUS
INTRAVENOUS | Status: AC
  Filled 2022-05-22: qty 20

## 2022-05-22 MED ORDER — PROPOFOL 500 MG/50ML IV EMUL
INTRAVENOUS | Status: DC | PRN
  Administered 2022-05-22: 40 ug/kg/min via INTRAVENOUS

## 2022-05-22 MED ORDER — ASPIRIN 81 MG PO CHEW
81.0000 mg | CHEWABLE_TABLET | Freq: Two times a day (BID) | ORAL | Status: DC
  Administered 2022-05-22: 81 mg via ORAL
  Filled 2022-05-22 (×2): qty 1

## 2022-05-22 MED ORDER — ONDANSETRON HCL 4 MG/2ML IJ SOLN
INTRAMUSCULAR | Status: AC
  Filled 2022-05-22: qty 2

## 2022-05-22 MED ORDER — TRANEXAMIC ACID-NACL 1000-0.7 MG/100ML-% IV SOLN
1000.0000 mg | Freq: Once | INTRAVENOUS | Status: AC
  Administered 2022-05-22: 1000 mg via INTRAVENOUS
  Filled 2022-05-22: qty 100

## 2022-05-22 MED ORDER — PHENYLEPHRINE HCL-NACL 20-0.9 MG/250ML-% IV SOLN
INTRAVENOUS | Status: DC | PRN
  Administered 2022-05-22: 40 ug/min via INTRAVENOUS

## 2022-05-22 MED ORDER — CHLORHEXIDINE GLUCONATE 0.12 % MT SOLN
15.0000 mL | Freq: Once | OROMUCOSAL | Status: AC
  Administered 2022-05-22: 15 mL via OROMUCOSAL

## 2022-05-22 MED ORDER — ONDANSETRON HCL 4 MG/2ML IJ SOLN: 4.0000 mg | Freq: Four times a day (QID) | INTRAMUSCULAR | Status: DC | PRN

## 2022-05-22 MED ORDER — METHOCARBAMOL 500 MG PO TABS: 500.0000 mg | ORAL_TABLET | Freq: Four times a day (QID) | ORAL | Status: DC | PRN

## 2022-05-22 MED ORDER — DOCUSATE SODIUM 100 MG PO CAPS
100.0000 mg | ORAL_CAPSULE | Freq: Two times a day (BID) | ORAL | Status: DC
  Administered 2022-05-22 – 2022-05-23 (×2): 100 mg via ORAL
  Filled 2022-05-22 (×2): qty 1

## 2022-05-22 MED ORDER — METHOCARBAMOL 500 MG IVPB - SIMPLE MED
500.0000 mg | Freq: Four times a day (QID) | INTRAVENOUS | Status: DC | PRN
  Administered 2022-05-22: 500 mg via INTRAVENOUS

## 2022-05-22 SURGICAL SUPPLY — 38 items
BAG COUNTER SPONGE SURGICOUNT (BAG) IMPLANT
BAG DECANTER FOR FLEXI CONT (MISCELLANEOUS) IMPLANT
BAG ZIPLOCK 12X15 (MISCELLANEOUS) IMPLANT
BLADE SAG 18X100X1.27 (BLADE) ×1 IMPLANT
COVER PERINEAL POST (MISCELLANEOUS) ×1 IMPLANT
COVER SURGICAL LIGHT HANDLE (MISCELLANEOUS) ×1 IMPLANT
CUP ACETBLR 52 OD PINNACLE (Hips) IMPLANT
DERMABOND ADVANCED .7 DNX12 (GAUZE/BANDAGES/DRESSINGS) ×1 IMPLANT
DRAPE FOOT SWITCH (DRAPES) ×1 IMPLANT
DRAPE STERI IOBAN 125X83 (DRAPES) ×1 IMPLANT
DRAPE U-SHAPE 47X51 STRL (DRAPES) ×2 IMPLANT
DRESSING AQUACEL AG SP 3.5X10 (GAUZE/BANDAGES/DRESSINGS) ×1 IMPLANT
DRSG AQUACEL AG SP 3.5X10 (GAUZE/BANDAGES/DRESSINGS) ×1
DURAPREP 26ML APPLICATOR (WOUND CARE) ×1 IMPLANT
ELECT REM PT RETURN 15FT ADLT (MISCELLANEOUS) ×1 IMPLANT
GLOVE BIO SURGEON STRL SZ 6 (GLOVE) ×1 IMPLANT
GLOVE BIOGEL PI IND STRL 6.5 (GLOVE) ×1 IMPLANT
GLOVE BIOGEL PI IND STRL 7.5 (GLOVE) ×1 IMPLANT
GLOVE ORTHO TXT STRL SZ7.5 (GLOVE) ×2 IMPLANT
GOWN STRL REUS W/ TWL LRG LVL3 (GOWN DISPOSABLE) ×3 IMPLANT
GOWN STRL REUS W/TWL LRG LVL3 (GOWN DISPOSABLE) ×3
HEAD M SROM 36MM PLUS 1.5 (Hips) IMPLANT
HOLDER FOLEY CATH W/STRAP (MISCELLANEOUS) ×1 IMPLANT
KIT TURNOVER KIT A (KITS) IMPLANT
LINER NEUTRAL 52X36MM PLUS 4 (Liner) IMPLANT
PACK ANTERIOR HIP CUSTOM (KITS) ×1 IMPLANT
SCREW 6.5MMX25MM (Screw) IMPLANT
SROM M HEAD 36MM PLUS 1.5 (Hips) ×1 IMPLANT
STEM FEMORAL SZ6 HIGH ACTIS (Stem) IMPLANT
SUT MNCRL AB 4-0 PS2 18 (SUTURE) ×1 IMPLANT
SUT STRATAFIX 0 PDS 27 VIOLET (SUTURE) ×1
SUT VIC AB 1 CT1 36 (SUTURE) ×3 IMPLANT
SUT VIC AB 2-0 CT1 27 (SUTURE) ×2
SUT VIC AB 2-0 CT1 TAPERPNT 27 (SUTURE) ×2 IMPLANT
SUTURE STRATFX 0 PDS 27 VIOLET (SUTURE) ×1 IMPLANT
TRAY FOLEY MTR SLVR 16FR STAT (SET/KITS/TRAYS/PACK) IMPLANT
TUBE SUCTION HIGH CAP CLEAR NV (SUCTIONS) ×1 IMPLANT
WATER STERILE IRR 1000ML POUR (IV SOLUTION) ×1 IMPLANT

## 2022-05-22 NOTE — Care Plan (Signed)
Ortho Bundle Case Management Note  Patient Details  Name: Julian Davis MRN: [id number] Date of Birth: [birth year]                   L THA on 05/22/22. DCP: Home with wife [name]. DME: No needs. Has RW. PT: HEP   DME Arranged:  N/A DME Agency:     HH Arranged:    [place] Agency:     Additional Comments: Please contact me with any questions of if this plan should need to change.  Marianne Sofia, RN,CCM [facility]  [phone] 05/22/2022, 9:25 AM

## 2022-05-22 NOTE — Discharge Instructions (Signed)

## 2022-05-22 NOTE — Evaluation (Signed)
Physical Therapy Evaluation Patient Details Name: Julian Davis MRN: [id number] DOB: 1927-07-27 Today's Date: 05/22/2022  History of Present Illness  Pt is a 88yo male presenting s/p L-THAAA on 05/22/22. 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   Clinical Impression  Julian Davis is a 88 y.o. male POD 0 s/p L-THAAA. Patient reports modified independence using SPC with mobility at baseline. Patient is now limited by functional impairments (see PT problem list below) and requires min guard for bed mobility and for transfers. Patient was able to ambulate 20 feet with RW and min guard level of assist. Patient instructed in exercise to facilitate ROM and circulation to manage edema.  Patient will benefit from continued skilled PT interventions to address impairments and progress towards PLOF. Acute PT will follow to progress mobility and stair training in preparation for safe discharge home.       Recommendations for follow up therapy are one component of a multi-disciplinary discharge planning process, led by the attending physician.  Recommendations may be updated based on patient status, additional functional criteria and insurance authorization.  Follow Up Recommendations Follow physician's recommendations for discharge plan and follow up therapies      Assistance Recommended at Discharge Frequent or constant Supervision/Assistance  Patient can return home with the following  A little help with walking and/or transfers;A little help with bathing/dressing/bathroom;Assistance with cooking/housework;Assist for transportation;Help with stairs or ramp for entrance    Equipment Recommendations None recommended by PT (Pt has recommended DME)  Recommendations for Other Services       Functional Status Assessment Patient has had a recent decline in their functional status and demonstrates the ability to make significant improvements in  function in a reasonable and predictable amount of time.     Precautions / Restrictions Precautions Precautions: Fall Restrictions Weight Bearing Restrictions: No Other Position/Activity Restrictions: WBAT      Mobility  Bed Mobility Overal bed mobility: Needs Assistance Bed Mobility: Supine to Sit, Sit to Supine     Supine to sit: Supervision Sit to supine: Min guard   General bed mobility comments: Min guard to supervision for safety only    Transfers Overall transfer level: Needs assistance Equipment used: Rolling walker (2 wheels) Transfers: Sit to/from Stand Sit to Stand: Min guard, From elevated surface           General transfer comment: Pt min guard from elevated surface for safety only, VCs for sequencing.    Ambulation/Gait Ambulation/Gait assistance: Min guard, +2 safety/equipment Gait Distance (Feet): 20 Feet Assistive device: Rolling walker (2 wheels) Gait Pattern/deviations: Step-to pattern Gait velocity: decreased     General Gait Details: Pt ambulated with RW min guard no physical assist required or overt LOB noted, +2 for lines and leads. HR monitored 90-121, SpO2 dropped to 80% but returned quickly once 5LO2 via [place] was resumed.  Stairs            Wheelchair Mobility    Modified Rankin (Stroke Patients Only)       Balance Overall balance assessment: Needs assistance Sitting-balance support: Feet supported, No upper extremity supported Sitting balance-Leahy Scale: Good     Standing balance support: Reliant on assistive device for balance, During functional activity, Bilateral upper extremity supported Standing balance-Leahy Scale: Poor  Pertinent Vitals/Pain Pain Assessment Pain Assessment: 0-10 Pain Score: 5  Pain Location: left hip Pain Descriptors / Indicators: Operative site guarding Pain Intervention(s): Limited activity within patient's tolerance, Monitored during session,  Repositioned, Ice applied    Home Living Family/patient expects to be discharged to:: Private residence Living Arrangements: Spouse/significant other Available Help at Discharge: Family;Available 24 hours/day Type of Home: House Home Access: Stairs to enter Entrance Stairs-Rails: None Entrance Stairs-Number of Steps: 1   Home Layout: One level;Laundry or work area in [place]: Conservation officer, nature (2 wheels);Cane - single point;Toilet riser;Grab bars - tub/shower      Prior Function Prior Level of Function : Independent/Modified Independent             Mobility Comments: SPC at all times the last week especially ADLs Comments: IND     Hand Dominance   Dominant Hand: Right    Extremity/Trunk Assessment   Upper Extremity Assessment Upper Extremity Assessment: Overall WFL for tasks assessed    Lower Extremity Assessment Lower Extremity Assessment: RLE deficits/detail;LLE deficits/detail RLE Deficits / Details: MMT ank DF/PF 5/5 RLE Sensation: WNL LLE Deficits / Details: MMT ank DF/PF 5/5 LLE Sensation: WNL    Cervical / Trunk Assessment Cervical / Trunk Assessment: Kyphotic  Communication   Communication: No difficulties  Cognition Arousal/Alertness: Awake/alert Behavior During Therapy: WFL for tasks assessed/performed Overall Cognitive Status: Within Functional Limits for tasks assessed                                          General Comments      Exercises Total Joint Exercises Ankle Circles/Pumps: AROM, Both, 10 reps   Assessment/Plan    PT Assessment Patient needs continued PT services  PT Problem List Decreased strength;Decreased range of motion;Decreased activity tolerance;Decreased balance;Decreased mobility;Decreased coordination;Pain       PT Treatment Interventions DME instruction;Gait training;Stair training;Functional mobility training;Therapeutic activities;Therapeutic exercise;Balance training;Neuromuscular  re-education;Patient/family education    PT Goals (Current goals can be found in the Care Plan section)  Acute Rehab PT Goals Patient Stated Goal: Get back to my housework and yardwork PT Goal Formulation: With patient Time For Goal Achievement: 05/29/22 Potential to Achieve Goals: Good    Frequency 7X/week     Co-evaluation               AM-PAC PT "6 Clicks" Mobility  Outcome Measure Help needed turning from your back to your side while in a flat bed without using bedrails?: A Little Help needed moving from lying on your back to sitting on the side of a flat bed without using bedrails?: A Little Help needed moving to and from a bed to a chair (including a wheelchair)?: A Little Help needed standing up from a chair using your arms (e.g., wheelchair or bedside chair)?: A Little Help needed to walk in hospital room?: A Little Help needed climbing 3-5 steps with a railing? : A Lot 6 Click Score: 17    End of Session Equipment Utilized During Treatment: Gait belt;Oxygen (Pt on 5L at start and end but removed for ambulation task only.) Activity Tolerance: Patient tolerated treatment well;No increased pain Patient left: in bed;with call bell/phone within reach;with bed alarm set;with SCD's reapplied Nurse Communication: Mobility status PT Visit Diagnosis: Pain;Difficulty in walking, not elsewhere classified (R26.2) Pain - Right/Left: Left Pain - part of body: Hip    Time: 6433-2951 PT Time  Calculation (min) (ACUTE ONLY): 22 min   Charges:   PT Evaluation $PT Eval Low Complexity: 1 Low          Coolidge Breeze, PT, DPT [facility] Rehabilitation Department Office: [phone] Weekend pager: [phone]  Coolidge Breeze 05/22/2022, 4:02 PM

## 2022-05-22 NOTE — Transfer of Care (Signed)
Immediate Anesthesia Transfer of Care Note  Patient: Julian Davis  Procedure(s) Performed: TOTAL HIP ARTHROPLASTY ANTERIOR APPROACH (Left: Hip)  Patient Location: PACU  Anesthesia Type:MAC and Spinal  Level of Consciousness: awake and patient cooperative  Airway & Oxygen Therapy: Patient Spontanous Breathing and Patient connected to face mask oxygen  Post-op Assessment: Report given to RN and Post -op Vital signs reviewed and stable  Post vital signs: Reviewed and stable  Last Vitals:  Vitals Value Taken Time  BP 93/66 05/22/22 1029  Temp    Pulse 67 05/22/22 1030  Resp 19 05/22/22 1029  SpO2 100 % 05/22/22 1030  Vitals shown include unvalidated device data.  Last Pain:  Vitals:   05/22/22 0635  TempSrc:   PainSc: 8       Patients Stated Pain Goal: 5 (07/61/51 8343)  Complications: No notable events documented.

## 2022-05-22 NOTE — Anesthesia Procedure Notes (Signed)
Procedure Name: MAC Date/Time: 05/22/2022 8:49 AM  Performed by: West Pugh, CRNAPre-anesthesia Checklist: Patient identified, Emergency Drugs available, Suction available, Patient being monitored and Timeout performed Patient Re-evaluated:Patient Re-evaluated prior to induction Oxygen Delivery Method: Simple face mask Preoxygenation: Pre-oxygenation with 100% oxygen Placement Confirmation: positive ETCO2 Dental Injury: Teeth and Oropharynx as per pre-operative assessment

## 2022-05-22 NOTE — Interval H&P Note (Signed)
History and Physical Interval Note:  05/22/2022 7:07 AM  Julian Davis  has presented today for surgery, with the diagnosis of Left hip osteoarthritis.  The various methods of treatment have been discussed with the patient and family. After consideration of risks, benefits and other options for treatment, the patient has consented to  Procedure(s): TOTAL HIP ARTHROPLASTY ANTERIOR APPROACH (Left) as a surgical intervention.  The patient's history has been reviewed, patient examined, no change in status, stable for surgery.  I have reviewed the patient's chart and labs.  Questions were answered to the patient's satisfaction.     Mauri Pole

## 2022-05-22 NOTE — Op Note (Signed)
NAME:  Julian Davis                ACCOUNT NO.: [id number]      MEDICAL RECORD NO.: [id number]      FACILITY:  [facility]      PHYSICIAN:  Mauri Pole  DATE OF BIRTH:  1927-07-02     DATE OF PROCEDURE:  05/22/2022                                 OPERATIVE REPORT         PREOPERATIVE DIAGNOSIS: Left  hip osteoarthritis.      POSTOPERATIVE DIAGNOSIS:  Left hip osteoarthritis.      PROCEDURE:  Left total hip replacement through an anterior approach   utilizing DePuy THR system, component size 52 mm pinnacle cup, a size 36+4 neutral   Altrex liner, a size 6 Hi Actis stem with a 36+1.5 Articuleze metal head ball.      SURGEON:  Pietro Cassis. Alvan Dame, M.D.      ASSISTANT:  Costella Hatcher, PA-C     ANESTHESIA:  Spinal.      SPECIMENS:  None.      COMPLICATIONS:  None.      BLOOD LOSS:  400 cc     DRAINS:  None.      INDICATION OF THE PROCEDURE:  Julian Davis is a 88 y.o. male who had   presented to office for evaluation of left hip pain.  Radiographs revealed   progressive degenerative changes with bone-on-bone   articulation of the  hip joint, including subchondral cystic changes and osteophytes.  The patient had painful limited range of   motion significantly affecting their overall quality of life and function.  The patient was failing to    respond to conservative measures including medications and/or injections and activity modification and at this point was ready   to proceed with more definitive measures.  Consent was obtained for   benefit of pain relief.  Specific risks of infection, DVT, component   failure, dislocation, neurovascular injury, and need for revision surgery were reviewed in the office.     PROCEDURE IN DETAIL:  The patient was brought to operative theater.   Once adequate anesthesia, preoperative antibiotics, 2 gm of Ancef, 1 gm of Tranexamic Acid, and 10 mg of Decadron were administered, the patient was positioned supine on the  [employer] table.  Once the patient was safely positioned with adequate padding of boney prominences we predraped out the hip, and used fluoroscopy to confirm orientation of the pelvis.      The left hip was then prepped and draped from proximal iliac crest to   mid thigh with a shower curtain technique.      Time-out was performed identifying the patient, planned procedure, and the appropriate extremity.     An incision was then made 2 cm lateral to the   anterior superior iliac spine extending over the orientation of the   tensor fascia lata muscle and sharp dissection was carried down to the   fascia of the muscle.      The fascia was then incised.  The muscle belly was identified and swept   laterally and retractor placed along the superior neck.  Following   cauterization of the circumflex vessels and removing some pericapsular   fat, a second cobra retractor was placed on the inferior neck.  A T-capsulotomy was made along the line of the   superior neck to the trochanteric fossa, then extended proximally and   distally.  Tag sutures were placed and the retractors were then placed   intracapsular.  We then identified the trochanteric fossa and   orientation of my neck cut and then made a neck osteotomy with the femur on traction.  The femoral   head was removed without difficulty or complication.  Traction was let   off and retractors were placed posterior and anterior around the   acetabulum.      The labrum and foveal tissue were debrided.  I began reaming with a 47 mm   reamer and reamed up to 51 mm reamer with good bony bed preparation and a 52 mm  cup was chosen.  The final 52 mm Pinnacle cup was then impacted under fluoroscopy to confirm the depth of penetration and orientation with respect to   Abduction and forward flexion.  A screw was placed into the ilium followed by the hole eliminator.  The final   36+4 neutral Altrex liner was impacted with good visualized rim fit.  The  cup was positioned anatomically within the acetabular portion of the pelvis.      At this point, the femur was rolled to 100 degrees.  Further capsule was   released off the inferior aspect of the femoral neck.  I then   released the superior capsule proximally.  With the leg in a neutral position the hook was placed laterally   along the femur under the vastus lateralis origin and elevated manually and then held in position using the hook attachment on the bed.  The leg was then extended and adducted with the leg rolled to 100   degrees of external rotation.  Retractors were placed along the medial calcar and posteriorly over the greater trochanter.  Once the proximal femur was fully   exposed, I used a box osteotome to set orientation.  I then began   broaching with the starting chili pepper broach and passed this by hand and then broached up to 6.  With the 6 broach in place I chose a high offset neck and did several trial reductions.  The offset was appropriate, leg lengths   appeared to be equal best matched with the +1.5 head ball trial confirmed radiographically.   Given these findings, I went ahead and dislocated the hip, repositioned all   retractors and positioned the right hip in the extended and abducted position.  The final 6 Hi Actis stem was   chosen and it was impacted down to the level of neck cut.  Based on this   and the trial reductions, a final 36+1.5 Articuleze metal head ball was chosen and   impacted onto a clean and dry trunnion, and the hip was reduced.  The   hip had been irrigated throughout the case again at this point.  I did   reapproximate the superior capsular leaflet to the anterior leaflet   using #1 Vicryl.  The fascia of the   tensor fascia lata muscle was then reapproximated using #1 Vicryl and #0 Stratafix sutures.  The   remaining wound was closed with 2-0 Vicryl and running 4-0 Monocryl.   The hip was cleaned, dried, and dressed sterilely using Dermabond  and   Aquacel dressing.  The patient was then brought   to recovery room in stable condition tolerating the procedure well.    Costella Hatcher,  PA-C was present for the entirety of the case involved from   preoperative positioning, perioperative retractor management, general   facilitation of the case, as well as primary wound closure as assistant.            Pietro Cassis Alvan Dame, M.D.        05/22/2022 8:56 AM

## 2022-05-22 NOTE — Anesthesia Procedure Notes (Signed)
Spinal  Patient location during procedure: OR Start time: 05/22/2022 8:49 AM End time: 05/22/2022 8:59 AM Reason for block: surgical anesthesia Staffing Performed: anesthesiologist  Anesthesiologist: Duane Boston, MD Performed by: Duane Boston, MD Authorized by: Duane Boston, MD   Preanesthetic Checklist Completed: patient identified, IV checked, risks and benefits discussed, surgical consent, monitors and equipment checked, pre-op evaluation and timeout performed Spinal Block Patient position: sitting Prep: DuraPrep Patient monitoring: cardiac monitor, continuous pulse ox and blood pressure Approach: right paramedian Injection technique: single-shot Needle Needle type: Pencan  Needle gauge: 24 G Needle length: 9 cm Assessment Events: CSF return Additional Notes Functioning IV was confirmed and monitors were applied. Sterile prep and drape, including hand hygiene and sterile gloves were used. The patient was positioned and the spine was prepped. The skin was anesthetized with lidocaine.  Free flow of clear CSF was obtained prior to injecting local anesthetic into the CSF.  The spinal needle aspirated freely following injection.  The needle was carefully withdrawn.  The patient tolerated the procedure well.

## 2022-05-22 NOTE — Anesthesia Postprocedure Evaluation (Signed)
Anesthesia Post Note  Patient: JACOBIE STAMEY  Procedure(s) Performed: TOTAL HIP ARTHROPLASTY ANTERIOR APPROACH (Left: Hip)     Patient location during evaluation: PACU Anesthesia Type: MAC Level of consciousness: awake and alert Pain management: pain level controlled Vital Signs Assessment: post-procedure vital signs reviewed and stable Respiratory status: spontaneous breathing and respiratory function stable Cardiovascular status: blood pressure returned to baseline and stable Postop Assessment: spinal receding Anesthetic complications: no   No notable events documented.  Last Vitals:  Vitals:   05/22/22 1215 05/22/22 1230  BP: 129/65 123/67  Pulse: 66 64  Resp: 17 16  Temp:  (!) 36 C  SpO2: 100% 100%    Last Pain:  Vitals:   05/22/22 1230  TempSrc:   PainSc: 5     LLE Motor Response: Purposeful movement (05/22/22 1230) LLE Sensation: Decreased;Numbness (05/22/22 1230) RLE Motor Response: Purposeful movement (05/22/22 1230) RLE Sensation: Decreased;Numbness (05/22/22 1230) L Sensory Level: L5-Outer lower leg, top of foot, great toe (05/22/22 1230) R Sensory Level: L5-Outer lower leg, top of foot, great toe (05/22/22 1230)  [place]

## 2022-05-22 NOTE — Plan of Care (Signed)
  Problem: Activity: Goal: Ability to avoid complications of mobility impairment will improve Outcome: Progressing Goal: Ability to tolerate increased activity will improve Outcome: Progressing   Problem: Pain Management: Goal: Pain level will decrease with appropriate interventions Outcome: Progressing   Problem: Safety: Goal: Ability to remain free from injury will improve Outcome: Progressing   

## 2022-05-22 NOTE — H&P (Signed)
TOTAL HIP ADMISSION H&P  Patient is admitted for left total hip arthroplasty.  Subjective:  Chief Complaint: left hip pain  HPI: Julian Davis, 88 y.o. male, has a history of pain and functional disability in the left hip(s) due to arthritis and patient has failed non-surgical conservative treatments for greater than 12 weeks to include NSAID's and/or analgesics and activity modification.  Onset of symptoms was gradual starting 2 years ago with gradually worsening course since that time.The patient noted no past surgery on the left hip(s).  Patient currently rates pain in the left hip at 8 out of 10 with activity. Patient has worsening of pain with activity and weight bearing, pain that interfers with activities of daily living, and pain with passive range of motion. Patient has evidence of joint space narrowing by imaging studies. This condition presents safety issues increasing the risk of falls.   There is no current active infection.  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 (gets injection every 8 weeks) Bilateral     Current Facility-Administered Medications  Medication Dose Route Frequency Provider Last Rate Last Admin   ceFAZolin (ANCEF) IVPB 2g/100 mL premix  2 g Intravenous On Call to OR Irving Copas, PA-C       dexamethasone (DECADRON) injection 8 mg  8 mg Intravenous  Once Irving Copas, PA-C       lactated ringers infusion   Intravenous Continuous Irving Copas, PA-C       lactated ringers infusion   Intravenous Continuous Janeece Riggers, MD 10 mL/hr at 05/22/22 6503 New Bag at 05/22/22 5465   phenylephrine (NEOSYNEPHRINE) 20-0.9 MG/250ML-% infusion            tranexamic acid (CYKLOKAPRON) IVPB 1,000 mg  1,000 mg Intravenous To OR Irving Copas, PA-C       Allergies  Allergen Reactions   Aspirin Other (See Comments)    Ulcer   Sulfa Antibiotics Hives    Social History   Tobacco Use   Smoking status: Never   Smokeless tobacco: Never  Substance Use Topics   Alcohol use: No    History reviewed. No pertinent family history.   Review of Systems  Constitutional:  Negative for chills and fever.  Respiratory:  Negative for cough and shortness of breath.   Cardiovascular:  Negative for chest pain.  Gastrointestinal:  Negative for nausea and vomiting.  Musculoskeletal:  Positive for arthralgias.     Objective:  Physical Exam Well nourished and well developed. General: Alert and oriented x3, cooperative and pleasant, no acute distress. Head: normocephalic, atraumatic, neck supple. Eyes: EOMI.  Musculoskeletal: Left hip exam: Painful and limited hip flexion internal rotation over 5 degrees with pelvic tilting, external rotation to 20 degrees Right hip exam is  otherwise pain-free with hip flexion internal rotation over 10 degrees with external rotation of 30 degrees   Calves soft and nontender. Motor function intact in LE. Strength 5/5 LE bilaterally. Neuro: Distal pulses 2+. Sensation to light touch intact in LE.  Vital signs in last 24 hours: Temp:  [97.9 F (36.6 C)] 97.9 F (36.6 C) (10/10 0610) Pulse Rate:  [75] 75 (10/10 0610) Resp:  [18] 18 (10/10 0610) BP: (143)/(86) 143/86 (10/10 0610) SpO2:  [98 %] 98 % (10/10 0610) Weight:  [60.8 kg] 60.8 kg (10/10 0635)  Labs:   Estimated body mass index is 20.37 kg/m as  calculated from the following:   Height as of this encounter: '5\' 8"'$  (1.727 m).   Weight as of this encounter: 60.8 kg.   Imaging Review Plain radiographs demonstrate severe degenerative joint disease of the left hip(s). The bone quality appears to be adequate for age and reported activity level.      Assessment/Plan:  End stage arthritis, left hip(s)  The patient history, physical examination, clinical judgement of the provider and imaging studies are consistent with end stage degenerative joint disease of the left hip(s) and total hip arthroplasty is deemed medically necessary. The treatment options including medical management, injection therapy, arthroscopy and arthroplasty were discussed at length. The risks and benefits of total hip arthroplasty were presented and reviewed. The risks due to aseptic loosening, infection, stiffness, dislocation/subluxation,  thromboembolic complications and other imponderables were discussed.  The patient acknowledged the explanation, agreed to proceed with the plan and consent was signed. Patient is being admitted for inpatient treatment for surgery, pain control, PT, OT, prophylactic antibiotics, VTE prophylaxis, progressive ambulation and ADL's and discharge planning.The patient is planning to be discharged  home.  Therapy Plans: HEP Disposition: Home with wife Planned DVT Prophylaxis: aspirin '81mg'$  BID DME needed: none PCP: Dr. Serita Grammes, clearance received TXA: IV Allergies: sulfa drugs - rash Anesthesia Concerns: none BMI: 21 Last HgbA1c: Not diabetic   Other: - Hx of SBO resolved with NGT and bowel rest in July 2023 - PCP selected request for Cardio clearance but he was not told this or referred and has no cardiac history - tylenol, methocarbamol, tramadol (5-10 pills) - Hx of GI bleed - ASA vs Xarelto??   Costella Hatcher, PA-C Orthopedic Surgery [facility] [place] [phone]

## 2022-05-23 ENCOUNTER — Encounter (HOSPITAL_COMMUNITY): Payer: Self-pay | Admitting: Orthopedic Surgery

## 2022-05-23 ENCOUNTER — Encounter (INDEPENDENT_AMBULATORY_CARE_PROVIDER_SITE_OTHER): Payer: Medicare Other | Admitting: Ophthalmology

## 2022-05-23 DIAGNOSIS — I10 Essential (primary) hypertension: Secondary | ICD-10-CM | POA: Diagnosis not present

## 2022-05-23 DIAGNOSIS — M1612 Unilateral primary osteoarthritis, left hip: Secondary | ICD-10-CM | POA: Diagnosis not present

## 2022-05-23 DIAGNOSIS — Z96651 Presence of right artificial knee joint: Secondary | ICD-10-CM | POA: Diagnosis not present

## 2022-05-23 LAB — BASIC METABOLIC PANEL
Anion gap: 4 — ABNORMAL LOW (ref 5–15)
BUN: 13 mg/dL (ref 8–23)
CO2: 26 mmol/L (ref 22–32)
Calcium: 7.7 mg/dL — ABNORMAL LOW (ref 8.9–10.3)
Chloride: 107 mmol/L (ref 98–111)
Creatinine, Ser: 0.81 mg/dL (ref 0.61–1.24)
GFR, Estimated: >60 mL/min (ref >60)
Glucose, Bld: 115 mg/dL — ABNORMAL HIGH (ref 70–99)
Potassium: 3.8 mmol/L (ref 3.5–5.1)
Sodium: 137 mmol/L (ref 135–145)

## 2022-05-23 LAB — CBC
HCT: 34.6 % — ABNORMAL LOW (ref 39.0–52.0)
Hemoglobin: 11.7 g/dL — ABNORMAL LOW (ref 13.0–17.0)
MCH: 34.1 pg — ABNORMAL HIGH (ref 26.0–34.0)
MCHC: 33.8 g/dL (ref 30.0–36.0)
MCV: 100.9 fL — ABNORMAL HIGH (ref 80.0–100.0)
Platelets: 127 10*3/uL — ABNORMAL LOW (ref 150–400)
RBC: 3.43 MIL/uL — ABNORMAL LOW (ref 4.22–5.81)
RDW: 12.8 % (ref 11.5–15.5)
WBC: 7.2 10*3/uL (ref 4.0–10.5)
nRBC: 0 % (ref 0.0–0.2)

## 2022-05-23 MED ORDER — POLYETHYLENE GLYCOL 3350 17 G PO PACK: 17.0000 g | PACK | Freq: Two times a day (BID) | ORAL | 0 refills | Status: AC

## 2022-05-23 MED ORDER — METHOCARBAMOL 500 MG PO TABS: 500.0000 mg | ORAL_TABLET | Freq: Four times a day (QID) | ORAL | 3 refills | Status: AC | PRN

## 2022-05-23 MED ORDER — SENNA 8.6 MG PO TABS: 2.0000 | ORAL_TABLET | Freq: Every day | ORAL | 0 refills | Status: AC

## 2022-05-23 MED ORDER — PANTOPRAZOLE SODIUM 40 MG PO TBEC: 40.0000 mg | DELAYED_RELEASE_TABLET | Freq: Every day | ORAL | 0 refills | Status: AC

## 2022-05-23 MED ORDER — TRAMADOL HCL 50 MG PO TABS: 50.0000 mg | ORAL_TABLET | Freq: Four times a day (QID) | ORAL | 0 refills | Status: AC | PRN

## 2022-05-23 MED ORDER — ACETAMINOPHEN 500 MG PO TABS: 1000.0000 mg | ORAL_TABLET | Freq: Four times a day (QID) | ORAL | 0 refills | Status: AC

## 2022-05-23 MED ORDER — ASPIRIN 81 MG PO CHEW: 81.0000 mg | CHEWABLE_TABLET | Freq: Two times a day (BID) | ORAL | 0 refills | Status: AC

## 2022-05-23 NOTE — Plan of Care (Signed)
  Problem: Activity: Goal: Risk for activity intolerance will decrease Outcome: Adequate for Discharge   Problem: Education: Goal: Knowledge of the prescribed therapeutic regimen will improve Outcome: Adequate for Discharge Goal: Understanding of discharge needs will improve Outcome: Adequate for Discharge Goal: Individualized Educational Video(s) Outcome: Adequate for Discharge   Problem: Activity: Goal: Ability to avoid complications of mobility impairment will improve Outcome: Adequate for Discharge Goal: Ability to tolerate increased activity will improve Outcome: Adequate for Discharge   Problem: Clinical Measurements: Goal: Postoperative complications will be avoided or minimized Outcome: Adequate for Discharge   Problem: Pain Management: Goal: Pain level will decrease with appropriate interventions Outcome: Adequate for Discharge   Problem: Skin Integrity: Goal: Will show signs of wound healing Outcome: Adequate for Discharge   Problem: Education: Goal: Knowledge of General Education information will improve Description: Including pain rating scale, medication(s)/side effects and non-pharmacologic comfort measures Outcome: Adequate for Discharge   Problem: Health Behavior/Discharge Planning: Goal: Ability to manage health-related needs will improve Outcome: Adequate for Discharge   Problem: Clinical Measurements: Goal: Ability to maintain clinical measurements within normal limits will improve Outcome: Adequate for Discharge Goal: Will remain free from infection Outcome: Adequate for Discharge Goal: Diagnostic test results will improve Outcome: Adequate for Discharge Goal: Respiratory complications will improve Outcome: Adequate for Discharge Goal: Cardiovascular complication will be avoided Outcome: Adequate for Discharge   Problem: Activity: Goal: Risk for activity intolerance will decrease Outcome: Adequate for Discharge   Problem: Nutrition: Goal:  Adequate nutrition will be maintained Outcome: Adequate for Discharge   Problem: Coping: Goal: Level of anxiety will decrease Outcome: Adequate for Discharge   Problem: Elimination: Goal: Will not experience complications related to bowel motility Outcome: Adequate for Discharge Goal: Will not experience complications related to urinary retention Outcome: Adequate for Discharge   Problem: Pain Managment: Goal: General experience of comfort will improve Outcome: Adequate for Discharge   Problem: Safety: Goal: Ability to remain free from injury will improve Outcome: Adequate for Discharge   Problem: Skin Integrity: Goal: Risk for impaired skin integrity will decrease Outcome: Adequate for Discharge

## 2022-05-23 NOTE — Progress Notes (Signed)
   Subjective: 1 Day Post-Op Procedure(s) (LRB): TOTAL HIP ARTHROPLASTY ANTERIOR APPROACH (Left) Patient reports pain as mild.   Patient seen in rounds by Dr. Alvan Dame. Patient is resting in bed on exam.  No acute events overnight. Foley catheter removed. Ambulated 20 feet with PT.  We will continue  therapy today.   Objective: Vital signs in last 24 hours: Temp:  [96.7 F (35.9 C)-98.7 F (37.1 C)] 98.7 F (37.1 C) (10/11 0508) Pulse Rate:  [58-92] 64 (10/11 0508) Resp:  [9-20] 18 (10/11 0508) BP: (93-162)/(52-89) 111/52 (10/11 0508) SpO2:  [94 %-100 %] 94 % (10/11 0508)  Intake/Output from previous day:  Intake/Output Summary (Last 24 hours) at 05/23/2022 0737 Last data filed at 05/23/2022 0600 Gross per 24 hour  Intake 2315 ml  Output 1675 ml  Net 640 ml     Intake/Output this shift: No intake/output data recorded.  Labs: Recent Labs    05/23/22 0322  HGB 11.7*   Recent Labs    05/23/22 0322  WBC 7.2  RBC 3.43*  HCT 34.6*  PLT 127*   Recent Labs    05/23/22 0322  NA 137  K 3.8  CL 107  CO2 26  BUN 13  CREATININE 0.81  GLUCOSE 115*  CALCIUM 7.7*   No results for input(s): "LABPT", "INR" in the last 72 hours.  Exam: General - Patient is Alert and Oriented Extremity - Neurologically intact Sensation intact distally Intact pulses distally Dorsiflexion/Plantar flexion intact Dressing - dressing C/D/I Motor Function - intact, moving foot and toes well on exam.   Past Medical History:  Diagnosis Date   Arthritis    Heart murmur    hx of   Hypertension    Status post right partial knee replacement     Assessment/Plan: 1 Day Post-Op Procedure(s) (LRB): TOTAL HIP ARTHROPLASTY ANTERIOR APPROACH (Left) Principal Problem:   S/P total left hip arthroplasty  Estimated body mass index is 20.37 kg/m as calculated from the following:   Height as of this encounter: '5\' 8"'$  (1.727 m).   Weight as of this encounter: 60.8 kg. Advance diet Up with  therapy D/C IV fluids  DVT Prophylaxis - Aspirin Weight bearing as tolerated.  Hgb stable at 11.7 this AM.  Plan is to go Home after hospital stay. Plan for discharge today after meeting goals with therapy. Follow up in the office in 2 weeks.   Griffith Citron, PA-C Orthopedic Surgery [phone] 05/23/2022, 7:37 AM

## 2022-05-23 NOTE — Progress Notes (Signed)
Physical Therapy Treatment Patient Details Name: Julian Davis MRN: [id number] DOB: 10/27/1926 Today's Date: 05/23/2022   History of Present Illness Pt is a 88 yo male presenting s/p L-THAAA on 05/22/22. 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    PT Comments    Patient making good progress with mobility and able to complete supine>sit with min guard/supervision and extra time. Patient required cues for technique with sit<>stand but able to power up with min guard/supervision only. Pt ambulated increased distance today with min guard only for ~200' with RW. EOS reviewed HEP for ROM and circulation. Will follow up for mobility training with family and stair training.   Recommendations for follow up therapy are one component of a multi-disciplinary discharge planning process, led by the attending physician.  Recommendations may be updated based on patient status, additional functional criteria and insurance authorization.  Follow Up Recommendations  Follow physician's recommendations for discharge plan and follow up therapies     Assistance Recommended at Discharge Frequent or constant Supervision/Assistance  Patient can return home with the following A little help with walking and/or transfers;A little help with bathing/dressing/bathroom;Assistance with cooking/housework;Assist for transportation;Help with stairs or ramp for entrance   Equipment Recommendations  None recommended by PT (Pt has recommended DME)    Recommendations for Other Services       Precautions / Restrictions Precautions Precautions: Fall Restrictions Weight Bearing Restrictions: No Other Position/Activity Restrictions: WBAT     Mobility  Bed Mobility Overal bed mobility: Needs Assistance Bed Mobility: Supine to Sit     Supine to sit: Supervision, HOB elevated, Min guard     General bed mobility comments: Min guard to supervision for safety, pt  taking extra time, cues to use belt for Lt LE    Transfers Overall transfer level: Needs assistance Equipment used: Rolling walker (2 wheels) Transfers: Sit to/from Stand Sit to Stand: Min guard           General transfer comment: guarding for power up from EOB, pt using bil UE to rise from EOB.    Ambulation/Gait Ambulation/Gait assistance: Min guard Gait Distance (Feet): 200 Feet Assistive device: Rolling walker (2 wheels) Gait Pattern/deviations: Step-to pattern, Decreased weight shift to left, Decreased stride length Gait velocity: decreased     General Gait Details: guarding for safety with cues for proximity to RW, pt occasionally keeping walker too far ahead. no buckling or LOB noted.   Stairs             Wheelchair Mobility    Modified Rankin (Stroke Patients Only)       Balance Overall balance assessment: Needs assistance Sitting-balance support: Feet supported, No upper extremity supported Sitting balance-Leahy Scale: Good     Standing balance support: Reliant on assistive device for balance, During functional activity, Bilateral upper extremity supported Standing balance-Leahy Scale: Poor                              Cognition Arousal/Alertness: Awake/alert Behavior During Therapy: WFL for tasks assessed/performed Overall Cognitive Status: Within Functional Limits for tasks assessed                                          Exercises Total Joint Exercises Ankle Circles/Pumps: AROM, Both, 10 reps Quad Sets: AROM, Both,  10 reps Heel Slides: AROM, Left, 10 reps Hip ABduction/ADduction: AROM, Left, 5 reps    General Comments        Pertinent Vitals/Pain Pain Assessment Pain Assessment: 0-10 Pain Score: 6  Pain Location: left hip Pain Descriptors / Indicators: Operative site guarding, Tightness Pain Intervention(s): Limited activity within patient's tolerance, Monitored during session, Premedicated before  session, Repositioned, RN gave pain meds during session, Ice applied    Home Living                          Prior Function            PT Goals (current goals can now be found in the care plan section) Acute Rehab PT Goals Patient Stated Goal: Get back to my housework and yardwork PT Goal Formulation: With patient Time For Goal Achievement: 05/29/22 Potential to Achieve Goals: Good Progress towards PT goals: Progressing toward goals    Frequency    7X/week      PT Plan Current plan remains appropriate    Co-evaluation              AM-PAC PT "6 Clicks" Mobility   Outcome Measure  Help needed turning from your back to your side while in a flat bed without using bedrails?: A Little Help needed moving from lying on your back to sitting on the side of a flat bed without using bedrails?: A Little Help needed moving to and from a bed to a chair (including a wheelchair)?: A Little Help needed standing up from a chair using your arms (e.g., wheelchair or bedside chair)?: A Little Help needed to walk in hospital room?: A Little Help needed climbing 3-5 steps with a railing? : A Lot 6 Click Score: 17    End of Session Equipment Utilized During Treatment: Gait belt;Oxygen (Pt on 5L at start and end but removed for ambulation task only.) Activity Tolerance: Patient tolerated treatment well;No increased pain Patient left: in bed;with call bell/phone within reach;with bed alarm set;with SCD's reapplied Nurse Communication: Mobility status PT Visit Diagnosis: Pain;Difficulty in walking, not elsewhere classified (R26.2) Pain - Right/Left: Left Pain - part of body: Hip     Time: 5038-8828 PT Time Calculation (min) (ACUTE ONLY): 28 min  Charges:  $Gait Training: 8-22 mins $Therapeutic Exercise: 8-22 mins                     Verner Mould, DPT Acute Rehabilitation Services Office [phone]  05/23/22 10:18 AM

## 2022-05-23 NOTE — TOC Transition Note (Signed)
Transition of Care [facility]) - CM/SW Discharge Note   Patient Details  Name: Julian Davis MRN: [id number] Date of Birth: [birth year]  Transition of Care [facility]) CM/SW Contact:  Lennart Pall, LCSW Phone Number: 05/23/2022, 10:57 AM   Clinical Narrative:     Met briefly with pt and confirming he has all needed DME at home.  Plan for HEP.  No TOC needs.  Final next level of care: Home/Self Care Barriers to Discharge: No Barriers Identified   Patient Goals and CMS Choice Patient states their goals for this hospitalization and ongoing recovery are:: return home      Discharge Placement                       Discharge Plan and Services                DME Arranged: N/A                    Social Determinants of Health (SDOH) Interventions     Readmission Risk Interventions     No data to display

## 2022-05-23 NOTE — Progress Notes (Signed)
Physical Therapy Treatment Patient Details Name: Julian Davis MRN: [id number] DOB: [birth year] Today's Date: 05/23/2022   History of Present Illness Pt is a 88 yo male presenting s/p L-THAAA on 05/22/22. 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    PT Comments    Patient progressing well with mobility and spouse present for family training. Pt able to complete sit<>stand with supervision and steady with stand. Pt's spouse guarding pt during gait and stair training with cues and supervision from therapist. EOS reviewed proper dressing technique and answered questions regarding HEP and discharge. Will continue to progress in acute setting.   Recommendations for follow up therapy are one component of a multi-disciplinary discharge planning process, led by the attending physician.  Recommendations may be updated based on patient status, additional functional criteria and insurance authorization.  Follow Up Recommendations  Follow physician's recommendations for discharge plan and follow up therapies     Assistance Recommended at Discharge Frequent or constant Supervision/Assistance  Patient can return home with the following A little help with walking and/or transfers;A little help with bathing/dressing/bathroom;Assistance with cooking/housework;Assist for transportation;Help with stairs or ramp for entrance   Equipment Recommendations  None recommended by PT (Pt has recommended DME)    Recommendations for Other Services       Precautions / Restrictions Precautions Precautions: Fall Restrictions Weight Bearing Restrictions: No LLE Weight Bearing: Weight bearing as tolerated Other Position/Activity Restrictions: WBAT     Mobility  Bed Mobility Overal bed mobility: Needs Assistance Bed Mobility: Supine to Sit     Supine to sit: Supervision, HOB elevated, Min guard     General bed mobility comments: Min guard to  supervision for safety, pt taking extra time, cues to use belt for Lt LE    Transfers Overall transfer level: Needs assistance Equipment used: Rolling walker (2 wheels) Transfers: Sit to/from Stand Sit to Stand: Supervision           General transfer comment: pt OOB in bathroom at start. supervision for stand from toilet and from recliner.    Ambulation/Gait Ambulation/Gait assistance: Supervision Gait Distance (Feet): 100 Feet Assistive device: Rolling walker (2 wheels) Gait Pattern/deviations: Step-to pattern, Decreased weight shift to left, Decreased stride length Gait velocity: decreased     General Gait Details: pt's spouse present and provided safe guarding with cues from therapist and supervision   Stairs Stairs: Yes Stairs assistance: Min guard, Supervision Stair Management: No rails, Step to pattern, Forwards, With walker Number of Stairs: 1 General stair comments: cues for step sequence with RW to negotiation single curb. Pt's spouse provided safe guarding for stair negotiation and assist to stabilize RW.   Wheelchair Mobility    Modified Rankin (Stroke Patients Only)       Balance Overall balance assessment: Needs assistance Sitting-balance support: Feet supported, No upper extremity supported Sitting balance-Leahy Scale: Good     Standing balance support: Reliant on assistive device for balance, During functional activity, Bilateral upper extremity supported Standing balance-Leahy Scale: Poor                              Cognition Arousal/Alertness: Awake/alert Behavior During Therapy: WFL for tasks assessed/performed Overall Cognitive Status: Within Functional Limits for tasks assessed  Exercises Total Joint Exercises Ankle Circles/Pumps: AROM, Both, 10 reps Quad Sets: AROM, Both, 10 reps Heel Slides: AROM, Left, 5 reps Hip ABduction/ADduction: AROM, Left, 5 reps Long Arc  Quad: AROM, Left, 10 reps    General Comments        Pertinent Vitals/Pain Pain Assessment Pain Assessment: 0-10 Pain Score: 6  Pain Location: left hip Pain Descriptors / Indicators: Operative site guarding, Tightness Pain Intervention(s): Limited activity within patient's tolerance, Monitored during session, Repositioned    Home Living                          Prior Function            PT Goals (current goals can now be found in the care plan section) Acute Rehab PT Goals Patient Stated Goal: Get back to my housework and yardwork PT Goal Formulation: With patient Time For Goal Achievement: 05/29/22 Potential to Achieve Goals: Good Progress towards PT goals: Progressing toward goals    Frequency    7X/week      PT Plan Current plan remains appropriate    Co-evaluation              AM-PAC PT "6 Clicks" Mobility   Outcome Measure  Help needed turning from your back to your side while in a flat bed without using bedrails?: A Little Help needed moving from lying on your back to sitting on the side of a flat bed without using bedrails?: A Little Help needed moving to and from a bed to a chair (including a wheelchair)?: A Little Help needed standing up from a chair using your arms (e.g., wheelchair or bedside chair)?: A Little Help needed to walk in hospital room?: A Little Help needed climbing 3-5 steps with a railing? : A Lot 6 Click Score: 17    End of Session Equipment Utilized During Treatment: Gait belt;Oxygen (Pt on 5L at start and end but removed for ambulation task only.) Activity Tolerance: Patient tolerated treatment well;No increased pain Patient left: in bed;with call bell/phone within reach;with bed alarm set;with SCD's reapplied Nurse Communication: Mobility status PT Visit Diagnosis: Pain;Difficulty in walking, not elsewhere classified (R26.2) Pain - Right/Left: Left Pain - part of body: Hip     Time: 7782-4235 PT Time  Calculation (min) (ACUTE ONLY): 17 min  Charges:  $Gait Training: 8-22 mins                     Verner Mould, DPT Acute Rehabilitation Services Office [phone]  05/23/22 2:49 PM

## 2022-05-30 ENCOUNTER — Encounter (INDEPENDENT_AMBULATORY_CARE_PROVIDER_SITE_OTHER): Payer: Medicare Other | Admitting: Ophthalmology

## 2022-05-30 DIAGNOSIS — H35033 Hypertensive retinopathy, bilateral: Secondary | ICD-10-CM

## 2022-05-30 DIAGNOSIS — H353231 Exudative age-related macular degeneration, bilateral, with active choroidal neovascularization: Secondary | ICD-10-CM | POA: Diagnosis not present

## 2022-05-30 DIAGNOSIS — I10 Essential (primary) hypertension: Secondary | ICD-10-CM | POA: Diagnosis not present

## 2022-05-30 DIAGNOSIS — H43813 Vitreous degeneration, bilateral: Secondary | ICD-10-CM | POA: Diagnosis not present

## 2022-05-30 NOTE — Discharge Summary (Signed)
Patient ID: JUANANGEL SODERHOLM MRN: [id number] DOB/AGE: [birth year] 86 y.o.  Admit date: 05/22/2022 Discharge date: 05/23/2022  Admission Diagnoses:  Left hip osteoarthritis  Discharge Diagnoses:  Principal Problem:   S/P total left hip arthroplasty   Past Medical History:  Diagnosis Date   Arthritis    Heart murmur    hx of   Hypertension    Status post right partial knee replacement     Surgeries: Procedure(s): TOTAL HIP ARTHROPLASTY ANTERIOR APPROACH on 05/22/2022   Consultants:   Discharged Condition: Improved  Hospital Course: VICTORIO CREEDEN is an 88 y.o. male who was admitted 05/22/2022 for operative treatment ofS/P total left hip arthroplasty. Patient has severe unremitting pain that affects sleep, daily activities, and work/hobbies. After pre-op clearance the patient was taken to the operating room on 05/22/2022 and underwent  Procedure(s): TOTAL HIP ARTHROPLASTY ANTERIOR APPROACH.    Patient was given perioperative antibiotics:  Anti-infectives (From admission, onward)    Start     Dose/Rate Route Frequency Ordered Stop   05/22/22 1700  ceFAZolin (ANCEF) IVPB 2g/100 mL premix        2 g 200 mL/hr over 30 Minutes Intravenous Every 6 hours 05/22/22 1551 05/23/22 0030   05/22/22 0615  ceFAZolin (ANCEF) IVPB 2g/100 mL premix        2 g 200 mL/hr over 30 Minutes Intravenous On call to O.R. 05/22/22 2025 05/22/22 4270        Patient was given sequential compression devices, early ambulation, and chemoprophylaxis to prevent DVT. Patient worked with PT and was meeting their goals regarding safe ambulation and transfers.  Patient benefited maximally from hospital stay and there were no complications.    Recent vital signs: No data found.   Recent laboratory studies: No results for input(s): "WBC", "HGB", "HCT", "PLT", "NA", "K", "CL", "CO2", "BUN", "CREATININE", "GLUCOSE", "INR", "CALCIUM" in the last 72 hours.  Invalid input(s): "PT", "2"   Discharge  Medications:   Allergies as of 05/23/2022       Reactions   Aspirin Other (See Comments)   Ulcer   Sulfa Antibiotics Hives        Medication List     TAKE these medications    acetaminophen 500 MG tablet Commonly known as: TYLENOL Take 2 tablets (1,000 mg total) by mouth every 6 (six) hours.   aspirin 81 MG chewable tablet Chew 1 tablet (81 mg total) by mouth 2 (two) times daily for 28 days.   benazepril 10 MG tablet Commonly known as: LOTENSIN Take 10 mg by mouth daily.   methocarbamol 500 MG tablet Commonly known as: ROBAXIN Take 1 tablet (500 mg total) by mouth every 6 (six) hours as needed for muscle spasms (muscle pain).   multivitamin with minerals Tabs tablet Take 1 tablet by mouth 3 (three) times daily.   oxymetazoline 0.05 % nasal spray Commonly known as: AFRIN Place 1 spray into both nostrils daily as needed for congestion.   pantoprazole 40 MG tablet Commonly known as: PROTONIX Take 1 tablet (40 mg total) by mouth daily.   polyethylene glycol 17 g packet Commonly known as: MIRALAX / GLYCOLAX Take 17 g by mouth 2 (two) times daily for 14 days.   senna 8.6 MG Tabs tablet Commonly known as: SENOKOT Take 2 tablets (17.2 mg total) by mouth at bedtime.   traMADol 50 MG tablet Commonly known as: ULTRAM Take 1-2 tablets (50-100 mg total) by mouth every 6 (six) hours as needed for moderate pain or severe pain.  Discharge Care Instructions  (From admission, onward)           Start     Ordered   05/23/22 0000  Change dressing       Comments: Maintain surgical dressing until follow up in the clinic. If the edges start to pull up, may reinforce with tape. If the dressing is no longer working, may remove and cover with gauze and tape, but must keep the area dry and clean.  Call with any questions or concerns.   05/23/22 0744            Diagnostic Studies: DG Pelvis Portable  Result Date: 05/22/2022 CLINICAL DATA:  Postop hip  arthroplasty. EXAM: PORTABLE PELVIS 1-2 VIEWS COMPARISON:  Abdominal radiograph 03/05/2022 and pelvic CT 03/04/2022. Intraoperative views same date. FINDINGS: Portable AP view of the pelvis at 1058 hours. Status post left total hip arthroplasty with a screw fixed acetabular component. As imaged in the single AP plane, the hardware appears well position. There is no evidence of acute fracture or dislocation. There is gas within the soft tissue surrounding the left hip. Mild degenerative changes in the right hip and lower lumbar spine. Diffuse vascular calcifications are noted. IMPRESSION: Status post left total hip arthroplasty without demonstrated complication. Electronically Signed   By: Richardean Sale M.D.   On: 05/22/2022 12:02   DG HIP UNILAT WITH PELVIS 1V LEFT  Result Date: 05/22/2022 CLINICAL DATA:  Anterior left hip arthroplasty. Intraoperative fluoroscopy. EXAM: DG HIP (WITH OR WITHOUT PELVIS) 1V*L* COMPARISON:  KUB 03/07/2022 FINDINGS: Images were performed intraoperatively without the presence of a radiologist. Severe superior left femoroacetabular joint space narrowing and bone-on-bone contact. The patient is undergoing total left hip arthroplasty. No hardware complication is seen. Total fluoroscopy images: 8 Total fluoroscopy time: 12 seconds Total dose: Radiation Exposure Index (as provided by the fluoroscopic device): 1.006 mGy air Kerma Please see intraoperative findings for further detail. IMPRESSION: Intraoperative fluoroscopy for total left hip arthroplasty. Electronically Signed   By: Yvonne Kendall M.D.   On: 05/22/2022 10:24   DG C-Arm 1-60 Min-No Report  Result Date: 05/22/2022 Fluoroscopy was utilized by the requesting physician.  No radiographic interpretation.   DG C-Arm 1-60 Min-No Report  Result Date: 05/22/2022 Fluoroscopy was utilized by the requesting physician.  No radiographic interpretation.    Disposition: Discharge disposition: 01-Home or Self  Care       Discharge Instructions     Call MD / Call 911   Complete by: As directed    If you experience chest pain or shortness of breath, CALL 911 and be transported to the hospital emergency room.  If you develope a fever above 101 F, pus (white drainage) or increased drainage or redness at the wound, or calf pain, call your surgeon's office.   Change dressing   Complete by: As directed    Maintain surgical dressing until follow up in the clinic. If the edges start to pull up, may reinforce with tape. If the dressing is no longer working, may remove and cover with gauze and tape, but must keep the area dry and clean.  Call with any questions or concerns.   Constipation Prevention   Complete by: As directed    Drink plenty of fluids.  Prune juice may be helpful.  You may use a stool softener, such as Colace (over the counter) 100 mg twice a day.  Use MiraLax (over the counter) for constipation as needed.   Diet - low sodium heart healthy  Complete by: As directed    Increase activity slowly as tolerated   Complete by: As directed    Weight bearing as tolerated with assist device (walker, cane, etc) as directed, use it as long as suggested by your surgeon or therapist, typically at least 4-6 weeks.   Post-operative opioid taper instructions:   Complete by: As directed    POST-OPERATIVE OPIOID TAPER INSTRUCTIONS: It is important to wean off of your opioid medication as soon as possible. If you do not need pain medication after your surgery it is ok to stop day one. Opioids include: Codeine, Hydrocodone(Norco, Vicodin), Oxycodone(Percocet, oxycontin) and hydromorphone amongst others.  Long term and even short term use of opiods can cause: Increased pain response Dependence Constipation Depression Respiratory depression And more.  Withdrawal symptoms can include Flu like symptoms Nausea, vomiting And more Techniques to manage these symptoms Hydrate well Eat regular healthy  meals Stay active Use relaxation techniques(deep breathing, meditating, yoga) Do Not substitute Alcohol to help with tapering If you have been on opioids for less than two weeks and do not have pain than it is ok to stop all together.  Plan to wean off of opioids This plan should start within one week post op of your joint replacement. Maintain the same interval or time between taking each dose and first decrease the dose.  Cut the total daily intake of opioids by one tablet each day Next start to increase the time between doses. The last dose that should be eliminated is the evening dose.      TED hose   Complete by: As directed    Use stockings (TED hose) for 2 weeks on both leg(s).  You may remove them at night for sleeping.        Follow-up Information     Paralee Cancel, MD. Go on 06/06/2022.   Specialty: Orthopedic Surgery Why: You are scheduled for first post op appointment on Wednesday Oct 25 at 9:00am. Contact information: [address] [place] [address] [phone]                  Signed: Irving Copas 05/30/2022, 8:23 AM

## 2022-06-22 DIAGNOSIS — D225 Melanocytic nevi of trunk: Secondary | ICD-10-CM | POA: Diagnosis not present

## 2022-06-22 DIAGNOSIS — L820 Inflamed seborrheic keratosis: Secondary | ICD-10-CM | POA: Diagnosis not present

## 2022-06-22 DIAGNOSIS — D2239 Melanocytic nevi of other parts of face: Secondary | ICD-10-CM | POA: Diagnosis not present

## 2022-06-22 DIAGNOSIS — L570 Actinic keratosis: Secondary | ICD-10-CM | POA: Diagnosis not present

## 2022-07-04 DIAGNOSIS — Z5189 Encounter for other specified aftercare: Secondary | ICD-10-CM | POA: Diagnosis not present

## 2022-07-18 ENCOUNTER — Encounter (INDEPENDENT_AMBULATORY_CARE_PROVIDER_SITE_OTHER): Payer: Medicare Other | Admitting: Ophthalmology

## 2022-07-18 DIAGNOSIS — H353231 Exudative age-related macular degeneration, bilateral, with active choroidal neovascularization: Secondary | ICD-10-CM | POA: Diagnosis not present

## 2022-07-18 DIAGNOSIS — I10 Essential (primary) hypertension: Secondary | ICD-10-CM

## 2022-07-18 DIAGNOSIS — H43813 Vitreous degeneration, bilateral: Secondary | ICD-10-CM | POA: Diagnosis not present

## 2022-07-18 DIAGNOSIS — H35033 Hypertensive retinopathy, bilateral: Secondary | ICD-10-CM

## 2022-08-15 DIAGNOSIS — Z23 Encounter for immunization: Secondary | ICD-10-CM | POA: Diagnosis not present

## 2022-09-05 ENCOUNTER — Encounter (INDEPENDENT_AMBULATORY_CARE_PROVIDER_SITE_OTHER): Payer: Medicare Other | Admitting: Ophthalmology

## 2022-09-05 DIAGNOSIS — H353231 Exudative age-related macular degeneration, bilateral, with active choroidal neovascularization: Secondary | ICD-10-CM

## 2022-09-05 DIAGNOSIS — H35033 Hypertensive retinopathy, bilateral: Secondary | ICD-10-CM | POA: Diagnosis not present

## 2022-09-05 DIAGNOSIS — H43813 Vitreous degeneration, bilateral: Secondary | ICD-10-CM | POA: Diagnosis not present

## 2022-09-05 DIAGNOSIS — I10 Essential (primary) hypertension: Secondary | ICD-10-CM

## 2022-10-16 DIAGNOSIS — R3915 Urgency of urination: Secondary | ICD-10-CM | POA: Diagnosis not present

## 2022-10-16 DIAGNOSIS — N401 Enlarged prostate with lower urinary tract symptoms: Secondary | ICD-10-CM | POA: Diagnosis not present

## 2022-10-16 DIAGNOSIS — R351 Nocturia: Secondary | ICD-10-CM | POA: Diagnosis not present

## 2022-10-16 DIAGNOSIS — R350 Frequency of micturition: Secondary | ICD-10-CM | POA: Diagnosis not present

## 2022-10-23 ENCOUNTER — Encounter (INDEPENDENT_AMBULATORY_CARE_PROVIDER_SITE_OTHER): Payer: Medicare Other | Admitting: Ophthalmology

## 2022-10-23 DIAGNOSIS — H43813 Vitreous degeneration, bilateral: Secondary | ICD-10-CM

## 2022-10-23 DIAGNOSIS — H35033 Hypertensive retinopathy, bilateral: Secondary | ICD-10-CM

## 2022-10-23 DIAGNOSIS — I10 Essential (primary) hypertension: Secondary | ICD-10-CM | POA: Diagnosis not present

## 2022-10-23 DIAGNOSIS — H353231 Exudative age-related macular degeneration, bilateral, with active choroidal neovascularization: Secondary | ICD-10-CM

## 2022-12-04 DIAGNOSIS — M19041 Primary osteoarthritis, right hand: Secondary | ICD-10-CM | POA: Diagnosis not present

## 2022-12-11 ENCOUNTER — Encounter (INDEPENDENT_AMBULATORY_CARE_PROVIDER_SITE_OTHER): Payer: Medicare Other | Admitting: Ophthalmology

## 2022-12-11 DIAGNOSIS — H43813 Vitreous degeneration, bilateral: Secondary | ICD-10-CM | POA: Diagnosis not present

## 2022-12-11 DIAGNOSIS — I10 Essential (primary) hypertension: Secondary | ICD-10-CM | POA: Diagnosis not present

## 2022-12-11 DIAGNOSIS — H353231 Exudative age-related macular degeneration, bilateral, with active choroidal neovascularization: Secondary | ICD-10-CM | POA: Diagnosis not present

## 2022-12-11 DIAGNOSIS — H35033 Hypertensive retinopathy, bilateral: Secondary | ICD-10-CM

## 2022-12-14 DIAGNOSIS — D485 Neoplasm of uncertain behavior of skin: Secondary | ICD-10-CM | POA: Diagnosis not present

## 2022-12-14 DIAGNOSIS — D2239 Melanocytic nevi of other parts of face: Secondary | ICD-10-CM | POA: Diagnosis not present

## 2022-12-14 DIAGNOSIS — D225 Melanocytic nevi of trunk: Secondary | ICD-10-CM | POA: Diagnosis not present

## 2022-12-14 DIAGNOSIS — L570 Actinic keratosis: Secondary | ICD-10-CM | POA: Diagnosis not present

## 2022-12-19 DIAGNOSIS — C44319 Basal cell carcinoma of skin of other parts of face: Secondary | ICD-10-CM | POA: Diagnosis not present

## 2023-01-29 ENCOUNTER — Encounter (INDEPENDENT_AMBULATORY_CARE_PROVIDER_SITE_OTHER): Payer: Medicare Other | Admitting: Ophthalmology

## 2023-01-29 DIAGNOSIS — H35033 Hypertensive retinopathy, bilateral: Secondary | ICD-10-CM

## 2023-01-29 DIAGNOSIS — H43813 Vitreous degeneration, bilateral: Secondary | ICD-10-CM | POA: Diagnosis not present

## 2023-01-29 DIAGNOSIS — H353231 Exudative age-related macular degeneration, bilateral, with active choroidal neovascularization: Secondary | ICD-10-CM

## 2023-01-29 DIAGNOSIS — I10 Essential (primary) hypertension: Secondary | ICD-10-CM

## 2023-02-13 DIAGNOSIS — H5213 Myopia, bilateral: Secondary | ICD-10-CM | POA: Diagnosis not present

## 2023-02-13 DIAGNOSIS — H353211 Exudative age-related macular degeneration, right eye, with active choroidal neovascularization: Secondary | ICD-10-CM | POA: Diagnosis not present

## 2023-02-13 DIAGNOSIS — H52223 Regular astigmatism, bilateral: Secondary | ICD-10-CM | POA: Diagnosis not present

## 2023-02-13 DIAGNOSIS — H353222 Exudative age-related macular degeneration, left eye, with inactive choroidal neovascularization: Secondary | ICD-10-CM | POA: Diagnosis not present

## 2023-02-13 DIAGNOSIS — H524 Presbyopia: Secondary | ICD-10-CM | POA: Diagnosis not present

## 2023-03-19 ENCOUNTER — Encounter (INDEPENDENT_AMBULATORY_CARE_PROVIDER_SITE_OTHER): Payer: Medicare Other | Admitting: Ophthalmology

## 2023-03-19 DIAGNOSIS — H353231 Exudative age-related macular degeneration, bilateral, with active choroidal neovascularization: Secondary | ICD-10-CM | POA: Diagnosis not present

## 2023-03-19 DIAGNOSIS — I10 Essential (primary) hypertension: Secondary | ICD-10-CM

## 2023-03-19 DIAGNOSIS — H43813 Vitreous degeneration, bilateral: Secondary | ICD-10-CM | POA: Diagnosis not present

## 2023-03-19 DIAGNOSIS — H35033 Hypertensive retinopathy, bilateral: Secondary | ICD-10-CM

## 2023-03-29 DIAGNOSIS — C44319 Basal cell carcinoma of skin of other parts of face: Secondary | ICD-10-CM | POA: Diagnosis not present

## 2023-03-29 DIAGNOSIS — D485 Neoplasm of uncertain behavior of skin: Secondary | ICD-10-CM | POA: Diagnosis not present

## 2023-03-29 DIAGNOSIS — L820 Inflamed seborrheic keratosis: Secondary | ICD-10-CM | POA: Diagnosis not present

## 2023-04-18 DIAGNOSIS — R351 Nocturia: Secondary | ICD-10-CM | POA: Diagnosis not present

## 2023-04-18 DIAGNOSIS — R3915 Urgency of urination: Secondary | ICD-10-CM | POA: Diagnosis not present

## 2023-04-18 DIAGNOSIS — N401 Enlarged prostate with lower urinary tract symptoms: Secondary | ICD-10-CM | POA: Diagnosis not present

## 2023-04-18 DIAGNOSIS — R350 Frequency of micturition: Secondary | ICD-10-CM | POA: Diagnosis not present

## 2023-04-18 DIAGNOSIS — Z125 Encounter for screening for malignant neoplasm of prostate: Secondary | ICD-10-CM | POA: Diagnosis not present

## 2023-05-07 ENCOUNTER — Encounter (INDEPENDENT_AMBULATORY_CARE_PROVIDER_SITE_OTHER): Payer: Medicare Other | Admitting: Ophthalmology

## 2023-05-07 DIAGNOSIS — I10 Essential (primary) hypertension: Secondary | ICD-10-CM | POA: Diagnosis not present

## 2023-05-07 DIAGNOSIS — H43813 Vitreous degeneration, bilateral: Secondary | ICD-10-CM | POA: Diagnosis not present

## 2023-05-07 DIAGNOSIS — H35033 Hypertensive retinopathy, bilateral: Secondary | ICD-10-CM | POA: Diagnosis not present

## 2023-05-07 DIAGNOSIS — H353231 Exudative age-related macular degeneration, bilateral, with active choroidal neovascularization: Secondary | ICD-10-CM

## 2023-05-22 DIAGNOSIS — R809 Proteinuria, unspecified: Secondary | ICD-10-CM | POA: Diagnosis not present

## 2023-05-22 DIAGNOSIS — E1169 Type 2 diabetes mellitus with other specified complication: Secondary | ICD-10-CM | POA: Diagnosis not present

## 2023-05-22 DIAGNOSIS — E1129 Type 2 diabetes mellitus with other diabetic kidney complication: Secondary | ICD-10-CM | POA: Diagnosis not present

## 2023-05-22 DIAGNOSIS — Z0000 Encounter for general adult medical examination without abnormal findings: Secondary | ICD-10-CM | POA: Diagnosis not present

## 2023-05-22 DIAGNOSIS — E785 Hyperlipidemia, unspecified: Secondary | ICD-10-CM | POA: Diagnosis not present

## 2023-05-22 DIAGNOSIS — H353211 Exudative age-related macular degeneration, right eye, with active choroidal neovascularization: Secondary | ICD-10-CM | POA: Diagnosis not present

## 2023-05-22 DIAGNOSIS — Z23 Encounter for immunization: Secondary | ICD-10-CM | POA: Diagnosis not present

## 2023-05-22 DIAGNOSIS — Z79899 Other long term (current) drug therapy: Secondary | ICD-10-CM | POA: Diagnosis not present

## 2023-05-29 DIAGNOSIS — Z23 Encounter for immunization: Secondary | ICD-10-CM | POA: Diagnosis not present

## 2023-06-03 DIAGNOSIS — Z6821 Body mass index (BMI) 21.0-21.9, adult: Secondary | ICD-10-CM | POA: Diagnosis not present

## 2023-06-03 DIAGNOSIS — I10 Essential (primary) hypertension: Secondary | ICD-10-CM | POA: Diagnosis not present

## 2023-06-11 DIAGNOSIS — M19041 Primary osteoarthritis, right hand: Secondary | ICD-10-CM | POA: Diagnosis not present

## 2023-06-21 DIAGNOSIS — D2239 Melanocytic nevi of other parts of face: Secondary | ICD-10-CM | POA: Diagnosis not present

## 2023-06-21 DIAGNOSIS — L570 Actinic keratosis: Secondary | ICD-10-CM | POA: Diagnosis not present

## 2023-06-21 DIAGNOSIS — D225 Melanocytic nevi of trunk: Secondary | ICD-10-CM | POA: Diagnosis not present

## 2023-06-21 DIAGNOSIS — D485 Neoplasm of uncertain behavior of skin: Secondary | ICD-10-CM | POA: Diagnosis not present

## 2023-06-21 DIAGNOSIS — L820 Inflamed seborrheic keratosis: Secondary | ICD-10-CM | POA: Diagnosis not present

## 2023-06-24 DIAGNOSIS — N401 Enlarged prostate with lower urinary tract symptoms: Secondary | ICD-10-CM | POA: Diagnosis not present

## 2023-06-24 DIAGNOSIS — E1159 Type 2 diabetes mellitus with other circulatory complications: Secondary | ICD-10-CM | POA: Diagnosis not present

## 2023-06-24 DIAGNOSIS — Z9229 Personal history of other drug therapy: Secondary | ICD-10-CM | POA: Diagnosis not present

## 2023-06-24 DIAGNOSIS — I152 Hypertension secondary to endocrine disorders: Secondary | ICD-10-CM | POA: Diagnosis not present

## 2023-06-25 ENCOUNTER — Encounter (INDEPENDENT_AMBULATORY_CARE_PROVIDER_SITE_OTHER): Payer: Medicare Other | Admitting: Ophthalmology

## 2023-06-25 DIAGNOSIS — I10 Essential (primary) hypertension: Secondary | ICD-10-CM

## 2023-06-25 DIAGNOSIS — H35033 Hypertensive retinopathy, bilateral: Secondary | ICD-10-CM | POA: Diagnosis not present

## 2023-06-25 DIAGNOSIS — H43813 Vitreous degeneration, bilateral: Secondary | ICD-10-CM | POA: Diagnosis not present

## 2023-06-25 DIAGNOSIS — H353231 Exudative age-related macular degeneration, bilateral, with active choroidal neovascularization: Secondary | ICD-10-CM | POA: Diagnosis not present

## 2023-07-05 DIAGNOSIS — D045 Carcinoma in situ of skin of trunk: Secondary | ICD-10-CM | POA: Diagnosis not present

## 2023-07-12 DIAGNOSIS — R350 Frequency of micturition: Secondary | ICD-10-CM | POA: Diagnosis not present

## 2023-07-12 DIAGNOSIS — N410 Acute prostatitis: Secondary | ICD-10-CM | POA: Diagnosis not present

## 2023-07-12 DIAGNOSIS — R3915 Urgency of urination: Secondary | ICD-10-CM | POA: Diagnosis not present

## 2023-08-05 ENCOUNTER — Encounter (INDEPENDENT_AMBULATORY_CARE_PROVIDER_SITE_OTHER): Payer: Medicare Other | Admitting: Ophthalmology

## 2023-08-05 DIAGNOSIS — H353231 Exudative age-related macular degeneration, bilateral, with active choroidal neovascularization: Secondary | ICD-10-CM | POA: Diagnosis not present

## 2023-08-05 DIAGNOSIS — H43813 Vitreous degeneration, bilateral: Secondary | ICD-10-CM

## 2023-08-05 DIAGNOSIS — H35033 Hypertensive retinopathy, bilateral: Secondary | ICD-10-CM | POA: Diagnosis not present

## 2023-08-05 DIAGNOSIS — I10 Essential (primary) hypertension: Secondary | ICD-10-CM | POA: Diagnosis not present

## 2023-09-09 ENCOUNTER — Encounter (INDEPENDENT_AMBULATORY_CARE_PROVIDER_SITE_OTHER): Payer: Medicare Other | Admitting: Ophthalmology

## 2023-09-09 DIAGNOSIS — H353231 Exudative age-related macular degeneration, bilateral, with active choroidal neovascularization: Secondary | ICD-10-CM

## 2023-09-09 DIAGNOSIS — H43813 Vitreous degeneration, bilateral: Secondary | ICD-10-CM | POA: Diagnosis not present

## 2023-09-09 DIAGNOSIS — H35033 Hypertensive retinopathy, bilateral: Secondary | ICD-10-CM | POA: Diagnosis not present

## 2023-09-09 DIAGNOSIS — I10 Essential (primary) hypertension: Secondary | ICD-10-CM

## 2023-09-26 DIAGNOSIS — H353211 Exudative age-related macular degeneration, right eye, with active choroidal neovascularization: Secondary | ICD-10-CM | POA: Diagnosis not present

## 2023-09-26 DIAGNOSIS — E1129 Type 2 diabetes mellitus with other diabetic kidney complication: Secondary | ICD-10-CM | POA: Diagnosis not present

## 2023-09-26 DIAGNOSIS — R809 Proteinuria, unspecified: Secondary | ICD-10-CM | POA: Diagnosis not present

## 2023-09-26 DIAGNOSIS — E1169 Type 2 diabetes mellitus with other specified complication: Secondary | ICD-10-CM | POA: Diagnosis not present

## 2023-10-04 DIAGNOSIS — D045 Carcinoma in situ of skin of trunk: Secondary | ICD-10-CM | POA: Diagnosis not present

## 2023-10-14 ENCOUNTER — Encounter (INDEPENDENT_AMBULATORY_CARE_PROVIDER_SITE_OTHER): Payer: Medicare Other | Admitting: Ophthalmology

## 2023-10-14 DIAGNOSIS — I10 Essential (primary) hypertension: Secondary | ICD-10-CM

## 2023-10-14 DIAGNOSIS — H35033 Hypertensive retinopathy, bilateral: Secondary | ICD-10-CM

## 2023-10-14 DIAGNOSIS — H353231 Exudative age-related macular degeneration, bilateral, with active choroidal neovascularization: Secondary | ICD-10-CM | POA: Diagnosis not present

## 2023-10-14 DIAGNOSIS — H43813 Vitreous degeneration, bilateral: Secondary | ICD-10-CM | POA: Diagnosis not present

## 2023-11-18 ENCOUNTER — Encounter (INDEPENDENT_AMBULATORY_CARE_PROVIDER_SITE_OTHER): Admitting: Ophthalmology

## 2023-11-18 DIAGNOSIS — H35033 Hypertensive retinopathy, bilateral: Secondary | ICD-10-CM | POA: Diagnosis not present

## 2023-11-18 DIAGNOSIS — H43813 Vitreous degeneration, bilateral: Secondary | ICD-10-CM | POA: Diagnosis not present

## 2023-11-18 DIAGNOSIS — I10 Essential (primary) hypertension: Secondary | ICD-10-CM | POA: Diagnosis not present

## 2023-11-18 DIAGNOSIS — H353231 Exudative age-related macular degeneration, bilateral, with active choroidal neovascularization: Secondary | ICD-10-CM

## 2023-11-21 DIAGNOSIS — R351 Nocturia: Secondary | ICD-10-CM | POA: Diagnosis not present

## 2023-11-21 DIAGNOSIS — R3915 Urgency of urination: Secondary | ICD-10-CM | POA: Diagnosis not present

## 2023-11-21 DIAGNOSIS — N401 Enlarged prostate with lower urinary tract symptoms: Secondary | ICD-10-CM | POA: Diagnosis not present

## 2023-12-04 DIAGNOSIS — M19041 Primary osteoarthritis, right hand: Secondary | ICD-10-CM | POA: Diagnosis not present

## 2023-12-23 ENCOUNTER — Encounter (INDEPENDENT_AMBULATORY_CARE_PROVIDER_SITE_OTHER): Admitting: Ophthalmology

## 2023-12-23 DIAGNOSIS — I10 Essential (primary) hypertension: Secondary | ICD-10-CM | POA: Diagnosis not present

## 2023-12-23 DIAGNOSIS — H43813 Vitreous degeneration, bilateral: Secondary | ICD-10-CM | POA: Diagnosis not present

## 2023-12-23 DIAGNOSIS — H35033 Hypertensive retinopathy, bilateral: Secondary | ICD-10-CM

## 2023-12-23 DIAGNOSIS — H353231 Exudative age-related macular degeneration, bilateral, with active choroidal neovascularization: Secondary | ICD-10-CM | POA: Diagnosis not present

## 2023-12-24 DIAGNOSIS — E1129 Type 2 diabetes mellitus with other diabetic kidney complication: Secondary | ICD-10-CM | POA: Diagnosis not present

## 2023-12-24 DIAGNOSIS — H353211 Exudative age-related macular degeneration, right eye, with active choroidal neovascularization: Secondary | ICD-10-CM | POA: Diagnosis not present

## 2023-12-24 DIAGNOSIS — R809 Proteinuria, unspecified: Secondary | ICD-10-CM | POA: Diagnosis not present

## 2023-12-24 DIAGNOSIS — E1159 Type 2 diabetes mellitus with other circulatory complications: Secondary | ICD-10-CM | POA: Diagnosis not present

## 2024-01-27 ENCOUNTER — Encounter (INDEPENDENT_AMBULATORY_CARE_PROVIDER_SITE_OTHER): Admitting: Ophthalmology

## 2024-01-27 DIAGNOSIS — H35033 Hypertensive retinopathy, bilateral: Secondary | ICD-10-CM | POA: Diagnosis not present

## 2024-01-27 DIAGNOSIS — H43813 Vitreous degeneration, bilateral: Secondary | ICD-10-CM | POA: Diagnosis not present

## 2024-01-27 DIAGNOSIS — H353231 Exudative age-related macular degeneration, bilateral, with active choroidal neovascularization: Secondary | ICD-10-CM

## 2024-01-27 DIAGNOSIS — I10 Essential (primary) hypertension: Secondary | ICD-10-CM | POA: Diagnosis not present

## 2024-03-02 ENCOUNTER — Encounter (INDEPENDENT_AMBULATORY_CARE_PROVIDER_SITE_OTHER): Admitting: Ophthalmology

## 2024-03-02 DIAGNOSIS — H43813 Vitreous degeneration, bilateral: Secondary | ICD-10-CM | POA: Diagnosis not present

## 2024-03-02 DIAGNOSIS — H35033 Hypertensive retinopathy, bilateral: Secondary | ICD-10-CM | POA: Diagnosis not present

## 2024-03-02 DIAGNOSIS — H353231 Exudative age-related macular degeneration, bilateral, with active choroidal neovascularization: Secondary | ICD-10-CM

## 2024-03-02 DIAGNOSIS — I10 Essential (primary) hypertension: Secondary | ICD-10-CM | POA: Diagnosis not present

## 2024-03-25 DIAGNOSIS — H353211 Exudative age-related macular degeneration, right eye, with active choroidal neovascularization: Secondary | ICD-10-CM | POA: Diagnosis not present

## 2024-03-25 DIAGNOSIS — E1129 Type 2 diabetes mellitus with other diabetic kidney complication: Secondary | ICD-10-CM | POA: Diagnosis not present

## 2024-03-25 DIAGNOSIS — R809 Proteinuria, unspecified: Secondary | ICD-10-CM | POA: Diagnosis not present

## 2024-03-25 DIAGNOSIS — E1159 Type 2 diabetes mellitus with other circulatory complications: Secondary | ICD-10-CM | POA: Diagnosis not present

## 2024-04-06 ENCOUNTER — Encounter (INDEPENDENT_AMBULATORY_CARE_PROVIDER_SITE_OTHER): Admitting: Ophthalmology

## 2024-04-09 DIAGNOSIS — H353231 Exudative age-related macular degeneration, bilateral, with active choroidal neovascularization: Secondary | ICD-10-CM | POA: Diagnosis not present

## 2024-04-10 DIAGNOSIS — L578 Other skin changes due to chronic exposure to nonionizing radiation: Secondary | ICD-10-CM | POA: Diagnosis not present

## 2024-04-10 DIAGNOSIS — L820 Inflamed seborrheic keratosis: Secondary | ICD-10-CM | POA: Diagnosis not present

## 2024-04-10 DIAGNOSIS — L570 Actinic keratosis: Secondary | ICD-10-CM | POA: Diagnosis not present

## 2024-04-10 DIAGNOSIS — D225 Melanocytic nevi of trunk: Secondary | ICD-10-CM | POA: Diagnosis not present

## 2024-04-10 DIAGNOSIS — D485 Neoplasm of uncertain behavior of skin: Secondary | ICD-10-CM | POA: Diagnosis not present

## 2024-04-10 DIAGNOSIS — D2239 Melanocytic nevi of other parts of face: Secondary | ICD-10-CM | POA: Diagnosis not present

## 2024-04-14 DIAGNOSIS — I10 Essential (primary) hypertension: Secondary | ICD-10-CM | POA: Diagnosis not present

## 2024-04-14 DIAGNOSIS — K219 Gastro-esophageal reflux disease without esophagitis: Secondary | ICD-10-CM | POA: Diagnosis not present

## 2024-04-14 DIAGNOSIS — R9431 Abnormal electrocardiogram [ECG] [EKG]: Secondary | ICD-10-CM | POA: Diagnosis not present

## 2024-04-16 DIAGNOSIS — C44329 Squamous cell carcinoma of skin of other parts of face: Secondary | ICD-10-CM | POA: Diagnosis not present

## 2024-04-21 DIAGNOSIS — R0989 Other specified symptoms and signs involving the circulatory and respiratory systems: Secondary | ICD-10-CM | POA: Diagnosis not present

## 2024-04-24 DIAGNOSIS — E1159 Type 2 diabetes mellitus with other circulatory complications: Secondary | ICD-10-CM | POA: Diagnosis not present

## 2024-04-24 DIAGNOSIS — Z6821 Body mass index (BMI) 21.0-21.9, adult: Secondary | ICD-10-CM | POA: Diagnosis not present

## 2024-04-24 DIAGNOSIS — I152 Hypertension secondary to endocrine disorders: Secondary | ICD-10-CM | POA: Diagnosis not present

## 2024-04-30 DIAGNOSIS — C44629 Squamous cell carcinoma of skin of left upper limb, including shoulder: Secondary | ICD-10-CM | POA: Diagnosis not present

## 2024-05-13 DIAGNOSIS — Z23 Encounter for immunization: Secondary | ICD-10-CM | POA: Diagnosis not present

## 2024-05-14 DIAGNOSIS — H353231 Exudative age-related macular degeneration, bilateral, with active choroidal neovascularization: Secondary | ICD-10-CM | POA: Diagnosis not present

## 2024-05-26 DIAGNOSIS — Z0000 Encounter for general adult medical examination without abnormal findings: Secondary | ICD-10-CM | POA: Diagnosis not present

## 2024-05-26 DIAGNOSIS — R3915 Urgency of urination: Secondary | ICD-10-CM | POA: Diagnosis not present

## 2024-05-26 DIAGNOSIS — N401 Enlarged prostate with lower urinary tract symptoms: Secondary | ICD-10-CM | POA: Diagnosis not present

## 2024-05-26 DIAGNOSIS — R351 Nocturia: Secondary | ICD-10-CM | POA: Diagnosis not present

## 2024-05-26 DIAGNOSIS — E1129 Type 2 diabetes mellitus with other diabetic kidney complication: Secondary | ICD-10-CM | POA: Diagnosis not present

## 2024-05-26 DIAGNOSIS — I152 Hypertension secondary to endocrine disorders: Secondary | ICD-10-CM | POA: Diagnosis not present

## 2024-05-26 DIAGNOSIS — E785 Hyperlipidemia, unspecified: Secondary | ICD-10-CM | POA: Diagnosis not present

## 2024-05-26 DIAGNOSIS — R809 Proteinuria, unspecified: Secondary | ICD-10-CM | POA: Diagnosis not present

## 2024-05-26 DIAGNOSIS — E1159 Type 2 diabetes mellitus with other circulatory complications: Secondary | ICD-10-CM | POA: Diagnosis not present

## 2024-05-26 DIAGNOSIS — Z23 Encounter for immunization: Secondary | ICD-10-CM | POA: Diagnosis not present

## 2024-06-18 DIAGNOSIS — H353231 Exudative age-related macular degeneration, bilateral, with active choroidal neovascularization: Secondary | ICD-10-CM | POA: Diagnosis not present

## 2024-07-03 DIAGNOSIS — C44629 Squamous cell carcinoma of skin of left upper limb, including shoulder: Secondary | ICD-10-CM | POA: Diagnosis not present

## 2024-07-03 DIAGNOSIS — C44329 Squamous cell carcinoma of skin of other parts of face: Secondary | ICD-10-CM | POA: Diagnosis not present

## 2024-07-03 DIAGNOSIS — L570 Actinic keratosis: Secondary | ICD-10-CM | POA: Diagnosis not present

## 2024-07-23 DIAGNOSIS — H353231 Exudative age-related macular degeneration, bilateral, with active choroidal neovascularization: Secondary | ICD-10-CM | POA: Diagnosis not present
